# Patient Record
Sex: Female | Born: 1950 | Race: White | Hispanic: No | Marital: Married | Smoking: Never smoker
Health system: Southern US, Community
[De-identification: ages and names within clinical notes are randomized; demographics above are authoritative.]

---

## 2011-08-19 ENCOUNTER — Encounter (HOSPITAL_COMMUNITY)
Admission: EM | Disposition: A | Discharge status: Home / Self Care | Payer: Self-pay | Source: Home / Self Care | Attending: Orthopedic Surgery

## 2011-08-19 ENCOUNTER — Other Ambulatory Visit: Payer: Self-pay

## 2011-08-19 ENCOUNTER — Encounter (HOSPITAL_COMMUNITY): Payer: Self-pay | Admitting: Emergency Medicine

## 2011-08-19 ENCOUNTER — Emergency Department (HOSPITAL_COMMUNITY): Payer: BC Managed Care – PPO

## 2011-08-19 ENCOUNTER — Emergency Department (HOSPITAL_COMMUNITY): Payer: BC Managed Care – PPO | Admitting: Anesthesiology

## 2011-08-19 ENCOUNTER — Inpatient Hospital Stay (HOSPITAL_COMMUNITY)
Admission: EM | Admit: 2011-08-19 | Discharge: 2011-08-23 | DRG: 218 | Disposition: A | Discharge status: Home / Self Care | Payer: BC Managed Care – PPO | Attending: Orthopedic Surgery | Admitting: Orthopedic Surgery

## 2011-08-19 ENCOUNTER — Encounter (HOSPITAL_COMMUNITY): Payer: Self-pay | Admitting: Anesthesiology

## 2011-08-19 DIAGNOSIS — R9431 Abnormal electrocardiogram [ECG] [EKG]: Secondary | ICD-10-CM

## 2011-08-19 DIAGNOSIS — R209 Unspecified disturbances of skin sensation: Secondary | ICD-10-CM | POA: Diagnosis present

## 2011-08-19 DIAGNOSIS — S62101A Fracture of unspecified carpal bone, right wrist, initial encounter for closed fracture: Secondary | ICD-10-CM

## 2011-08-19 DIAGNOSIS — W108XXA Fall (on) (from) other stairs and steps, initial encounter: Secondary | ICD-10-CM | POA: Diagnosis present

## 2011-08-19 DIAGNOSIS — S52123A Displaced fracture of head of unspecified radius, initial encounter for closed fracture: Secondary | ICD-10-CM | POA: Diagnosis present

## 2011-08-19 DIAGNOSIS — S0990XA Unspecified injury of head, initial encounter: Secondary | ICD-10-CM

## 2011-08-19 DIAGNOSIS — G5600 Carpal tunnel syndrome, unspecified upper limb: Secondary | ICD-10-CM | POA: Diagnosis present

## 2011-08-19 DIAGNOSIS — S12110A Anterior displaced Type II dens fracture, initial encounter for closed fracture: Secondary | ICD-10-CM

## 2011-08-19 DIAGNOSIS — S52009A Unspecified fracture of upper end of unspecified ulna, initial encounter for closed fracture: Secondary | ICD-10-CM | POA: Diagnosis present

## 2011-08-19 DIAGNOSIS — S42401A Unspecified fracture of lower end of right humerus, initial encounter for closed fracture: Secondary | ICD-10-CM

## 2011-08-19 DIAGNOSIS — S52599A Other fractures of lower end of unspecified radius, initial encounter for closed fracture: Secondary | ICD-10-CM | POA: Diagnosis present

## 2011-08-19 DIAGNOSIS — S42453A Displaced fracture of lateral condyle of unspecified humerus, initial encounter for closed fracture: Principal | ICD-10-CM | POA: Diagnosis present

## 2011-08-19 DIAGNOSIS — S12100A Unspecified displaced fracture of second cervical vertebra, initial encounter for closed fracture: Secondary | ICD-10-CM | POA: Diagnosis present

## 2011-08-19 HISTORY — PX: ORIF ELBOW FRACTURE: SHX5031

## 2011-08-19 LAB — BASIC METABOLIC PANEL
Chloride: 102 mEq/L (ref 96–112)
Creatinine, Ser: 1 mg/dL (ref 0.50–1.10)
GFR calc Af Amer: 68 mL/min — ABNORMAL LOW (ref >90)
Potassium: 3 mEq/L — ABNORMAL LOW (ref 3.5–5.1)

## 2011-08-19 LAB — DIFFERENTIAL
Basophils Absolute: 0.1 10*3/uL (ref 0.0–0.1)
Basophils Relative: 0 % (ref 0–1)
Eosinophils Absolute: 0.2 K/uL (ref 0.0–0.7)
Eosinophils Relative: 1 % (ref 0–5)
Lymphocytes Relative: 16 % (ref 12–46)
Lymphs Abs: 2.5 K/uL (ref 0.7–4.0)
Monocytes Absolute: 1 K/uL (ref 0.1–1.0)
Monocytes Relative: 6 % (ref 3–12)
Neutro Abs: 11.8 10*3/uL — ABNORMAL HIGH (ref 1.7–7.7)
Neutrophils Relative %: 76 % (ref 43–77)

## 2011-08-19 LAB — BASIC METABOLIC PANEL WITH GFR
BUN: 16 mg/dL (ref 6–23)
CO2: 22 meq/L (ref 19–32)
Calcium: 8.7 mg/dL (ref 8.4–10.5)
GFR calc non Af Amer: 59 mL/min — ABNORMAL LOW (ref >90)
Glucose, Bld: 155 mg/dL — ABNORMAL HIGH (ref 70–99)
Sodium: 139 meq/L (ref 135–145)

## 2011-08-19 LAB — CBC
HCT: 37.1 % (ref 36.0–46.0)
Hemoglobin: 12.6 g/dL (ref 12.0–15.0)
MCH: 29 pg (ref 26.0–34.0)
MCHC: 34 g/dL (ref 30.0–36.0)
MCV: 85.3 fL (ref 78.0–100.0)
Platelets: 299 K/uL (ref 150–400)
RBC: 4.35 MIL/uL (ref 3.87–5.11)
RDW: 12.9 % (ref 11.5–15.5)
WBC: 15.5 K/uL — ABNORMAL HIGH (ref 4.0–10.5)

## 2011-08-19 LAB — POTASSIUM: Potassium: 3.4 mEq/L — ABNORMAL LOW (ref 3.5–5.1)

## 2011-08-19 SURGERY — OPEN REDUCTION INTERNAL FIXATION (ORIF) ELBOW/OLECRANON FRACTURE
Anesthesia: General | Site: Wrist | Laterality: Right | Wound class: Clean

## 2011-08-19 MED ORDER — MIDAZOLAM HCL 5 MG/5ML IJ SOLN
INTRAMUSCULAR | Status: DC | PRN
  Administered 2011-08-19: 2 mg via INTRAVENOUS

## 2011-08-19 MED ORDER — PROPOFOL 10 MG/ML IV BOLUS
INTRAVENOUS | Status: DC | PRN
  Administered 2011-08-19: 150 mg via INTRAVENOUS

## 2011-08-19 MED ORDER — LACTATED RINGERS IV SOLN
INTRAVENOUS | Status: DC | PRN
  Administered 2011-08-19 – 2011-08-20 (×2): via INTRAVENOUS

## 2011-08-19 MED ORDER — ACETAMINOPHEN 10 MG/ML IV SOLN
INTRAVENOUS | Status: DC | PRN
  Administered 2011-08-19: 1000 mg via INTRAVENOUS

## 2011-08-19 MED ORDER — SCOPOLAMINE 1 MG/3DAYS TD PT72
MEDICATED_PATCH | TRANSDERMAL | Status: AC
  Filled 2011-08-19: qty 1

## 2011-08-19 MED ORDER — ACETAMINOPHEN 10 MG/ML IV SOLN
INTRAVENOUS | Status: AC
  Filled 2011-08-19: qty 100

## 2011-08-19 MED ORDER — FENTANYL CITRATE 0.05 MG/ML IJ SOLN
INTRAMUSCULAR | Status: DC | PRN
  Administered 2011-08-19: 100 ug via INTRAVENOUS
  Administered 2011-08-20 (×10): 25 ug via INTRAVENOUS

## 2011-08-19 MED ORDER — DEXAMETHASONE SODIUM PHOSPHATE 10 MG/ML IJ SOLN
INTRAMUSCULAR | Status: DC | PRN
  Administered 2011-08-19: 10 mg via INTRAVENOUS

## 2011-08-19 MED ORDER — ONDANSETRON HCL 4 MG/2ML IJ SOLN
INTRAMUSCULAR | Status: AC
  Administered 2011-08-19 (×2): 4 mg via INTRAVENOUS
  Filled 2011-08-19: qty 2

## 2011-08-19 MED ORDER — ROCURONIUM BROMIDE 100 MG/10ML IV SOLN
INTRAVENOUS | Status: DC | PRN
  Administered 2011-08-19: 20 mg via INTRAVENOUS

## 2011-08-19 MED ORDER — SCOPOLAMINE 1 MG/3DAYS TD PT72
MEDICATED_PATCH | TRANSDERMAL | Status: DC | PRN
  Administered 2011-08-19: 1 via TRANSDERMAL

## 2011-08-19 MED ORDER — SUCCINYLCHOLINE CHLORIDE 20 MG/ML IJ SOLN
INTRAMUSCULAR | Status: DC | PRN
  Administered 2011-08-19: 100 mg via INTRAVENOUS

## 2011-08-19 MED ORDER — HYDROMORPHONE HCL PF 1 MG/ML IJ SOLN
0.5000 mg | Freq: Once | INTRAMUSCULAR | Status: AC
  Administered 2011-08-19: 0.5 mg via INTRAVENOUS
  Filled 2011-08-19: qty 1

## 2011-08-19 MED ORDER — CEFAZOLIN SODIUM-DEXTROSE 2-3 GM-% IV SOLR
INTRAVENOUS | Status: AC
  Filled 2011-08-19: qty 50

## 2011-08-19 MED ORDER — CEFAZOLIN SODIUM 1-5 GM-% IV SOLN
INTRAVENOUS | Status: DC | PRN
  Administered 2011-08-19: 2 g via INTRAVENOUS

## 2011-08-19 SURGICAL SUPPLY — 72 items
BAG ZIPLOCK 12X15 (MISCELLANEOUS) ×3 IMPLANT
BANDAGE GAUZE ELAST BULKY 4 IN (GAUZE/BANDAGES/DRESSINGS) ×6 IMPLANT
BIT DRILL 2 FAST STEP (BIT) ×3 IMPLANT
BIT DRILL 2.5X2.75 QC CALB (BIT) ×3 IMPLANT
BIT DRILL 2.5X4 QC (BIT) ×3 IMPLANT
BIT DRILL CALIBRATED 2.7 (BIT) ×3 IMPLANT
BIT DRILL MICR ACTRK 2 LNG PRF (BIT) ×2 IMPLANT
BLADE SURG SZ10 CARB STEEL (BLADE) ×6 IMPLANT
BNDG COHESIVE 4X5 TAN STRL (GAUZE/BANDAGES/DRESSINGS) ×6 IMPLANT
CLOTH BEACON ORANGE TIMEOUT ST (SAFETY) ×3 IMPLANT
CORDS BIPOLAR (ELECTRODE) ×3 IMPLANT
CUFF TOURN SGL QUICK 18 (TOURNIQUET CUFF) ×3 IMPLANT
DRAIN PENROSE 18X1/4 LTX STRL (WOUND CARE) IMPLANT
DRAPE BACK TABLE (DRAPES) ×3 IMPLANT
DRAPE C-ARM 42X72 X-RAY (DRAPES) IMPLANT
DRAPE OEC MINIVIEW 54X84 (DRAPES) ×3 IMPLANT
DRAPE SURG 17X11 SM STRL (DRAPES) ×3 IMPLANT
DRILL MICRO ACUTRAK 2 LNG PROF (BIT) ×3
DRSG EMULSION OIL 3X3 NADH (GAUZE/BANDAGES/DRESSINGS) ×3 IMPLANT
ELECT REM PT RETURN 9FT ADLT (ELECTROSURGICAL) ×3
ELECTRODE REM PT RTRN 9FT ADLT (ELECTROSURGICAL) ×2 IMPLANT
GAUZE XEROFORM 5X9 LF (GAUZE/BANDAGES/DRESSINGS) ×3 IMPLANT
GLOVE BIO SURGEON STRL SZ8 (GLOVE) ×9 IMPLANT
GOWN STRL REIN XL XLG (GOWN DISPOSABLE) ×6 IMPLANT
GUIDEWIRE ORTHO MICROSHT  ACUT (WIRE) ×3
GUIDEWIRE ORTHO MICROSHT .035 (WIRE) ×6 IMPLANT
KIT BASIN OR (CUSTOM PROCEDURE TRAY) ×3 IMPLANT
LOOP VESSEL MAXI BLUE (MISCELLANEOUS) ×3 IMPLANT
MANIFOLD NEPTUNE II (INSTRUMENTS) IMPLANT
NS IRRIG 1000ML POUR BTL (IV SOLUTION) ×3 IMPLANT
PACK LOWER EXTREMITY WL (CUSTOM PROCEDURE TRAY) ×3 IMPLANT
PAD CAST 3X4 CTTN HI CHSV (CAST SUPPLIES) ×6 IMPLANT
PAD CAST 4YDX4 CTTN HI CHSV (CAST SUPPLIES) ×6 IMPLANT
PADDING CAST COTTON 3X4 STRL (CAST SUPPLIES) ×3
PADDING CAST COTTON 4X4 STRL (CAST SUPPLIES) ×3
PADDING WEBRIL 4 STERILE (GAUZE/BANDAGES/DRESSINGS) ×3 IMPLANT
PEG SUBCHONDRAL SMOOTH 2.0X16 (Peg) ×3 IMPLANT
PEG SUBCHONDRAL SMOOTH 2.0X20 (Peg) ×9 IMPLANT
PEG THREADED 2.5MMX18MM LONG (Peg) ×3 IMPLANT
PEG THREADED 2.5MMX20MM LONG (Peg) ×9 IMPLANT
PEG THREADED 2.5MMX22MM LONG (Peg) ×3 IMPLANT
PLATE LOCK RT SM (Plate) ×1 IMPLANT
PLATE LOCK RT SM 7 HL (Plate) ×2 IMPLANT
PLATE STAN 24.4X59.5 RT (Plate) ×3 IMPLANT
POSITIONER SURGICAL ARM (MISCELLANEOUS) ×3 IMPLANT
PUTTY ORTHOBLAST II 5CC (Orthopedic Implant) ×3 IMPLANT
SCOTCHCAST PLUS 2X4 WHITE (CAST SUPPLIES) ×3 IMPLANT
SCOTCHCAST PLUS 5X4 WHITE (CAST SUPPLIES) ×6 IMPLANT
SCREW ACUTRAK 2 MICRO 16MM (Screw) ×3 IMPLANT
SCREW BN 12X3.5XNS CORT TI (Screw) ×2 IMPLANT
SCREW CORT 3.5X10 LNG (Screw) ×9 IMPLANT
SCREW CORT 3.5X12 (Screw) ×1 IMPLANT
SCREW CORT T15 28X3.5XST LCK (Screw) ×2 IMPLANT
SCREW CORTICAL 3.5X28MM (Screw) ×1 IMPLANT
SCREW LOCK 3.5X36 DIST TIB (Screw) ×3 IMPLANT
SCREW LOCK CORT STAR 3.5X18 (Screw) ×3 IMPLANT
SOL PREP POV-IOD 16OZ 10% (MISCELLANEOUS) ×3 IMPLANT
SOL PREP PROV IODINE SCRUB 4OZ (MISCELLANEOUS) ×3 IMPLANT
SPONGE GAUZE 4X4 12PLY (GAUZE/BANDAGES/DRESSINGS) ×3 IMPLANT
SPONGE LAP 4X18 X RAY DECT (DISPOSABLE) ×18 IMPLANT
SPONGE SURGIFOAM ABS GEL 100 (HEMOSTASIS) ×3 IMPLANT
SUT FIBERWIRE #2 38 T-5 BLUE (SUTURE) ×3
SUT MERSILENE 3 0 FS 1 (SUTURE) IMPLANT
SUT PROLENE 3 0 PS 2 (SUTURE) IMPLANT
SUT PROLENE 4 0 PS 2 18 (SUTURE) ×18 IMPLANT
SUT VIC AB 1 CTX 36 (SUTURE)
SUT VIC AB 1 CTX36XBRD ANBCTR (SUTURE) IMPLANT
SUT VIC AB 2-0 CTX 36 (SUTURE) IMPLANT
SUTURE FIBERWR #2 38 T-5 BLUE (SUTURE) ×2 IMPLANT
TOWEL OR 17X26 10 PK STRL BLUE (TOWEL DISPOSABLE) ×3 IMPLANT
WASHER 3.5MM (Orthopedic Implant) ×3 IMPLANT
WATER STERILE IRR 1500ML POUR (IV SOLUTION) IMPLANT

## 2011-08-19 NOTE — Preoperative (Signed)
Beta Blockers   Reason not to administer Beta Blockers:Not Applicable 

## 2011-08-19 NOTE — ED Notes (Signed)
Permit signed by spouse of or

## 2011-08-19 NOTE — ED Notes (Signed)
[facility]RE[facility] NEUROLOGY will replac

## 2011-08-19 NOTE — ED Notes (Signed)
OR called with the repeat Potassium of 3.4

## 2011-08-19 NOTE — Consult Note (Signed)
Reason for Consult: C2 fracture Referring Physician: Quita Skye M.D.  Julie Russell is an 61 y.o. female.  HPI: Patient is a 61 year old individual who took a fall down a flight of stairs no head over heels. She sustained a fracture to the right upper extremity and complained of some neck pain. CT scan demonstrates the presence of a type II odontoid fracture is nondisplaced. Neurologically the patient has been intact. Save for the right upper extremity which is in a splint.  History reviewed. No pertinent past medical history.  No past surgical history on file.  No family history on file.  Social History:  does not have a smoking history on file. She does not have any smokeless tobacco history on file. Her alcohol and drug histories not on file.  Allergies: No Known Allergies  Medications: None available  Results for orders placed during the hospital encounter of 08/19/11 (from the past 48 hour(s))  CBC     Status: Abnormal   Collection Time   08/19/11  8:47 PM      Component Value Range Comment   WBC 15.5 (*) 4.0 - 10.5 (K/uL)    RBC 4.35  3.87 - 5.11 (MIL/uL)    Hemoglobin 12.6  12.0 - 15.0 (g/dL)    HCT 16.1  09.6 - 04.5 (%)    MCV 85.3  78.0 - 100.0 (fL)    MCH 29.0  26.0 - 34.0 (pg)    MCHC 34.0  30.0 - 36.0 (g/dL)    RDW 40.9  81.1 - 91.4 (%)    Platelets 299  150 - 400 (K/uL)   DIFFERENTIAL     Status: Abnormal   Collection Time   08/19/11  8:47 PM      Component Value Range Comment   Neutrophils Relative 76  43 - 77 (%)    Neutro Abs 11.8 (*) 1.7 - 7.7 (K/uL)    Lymphocytes Relative 16  12 - 46 (%)    Lymphs Abs 2.5  0.7 - 4.0 (K/uL)    Monocytes Relative 6  3 - 12 (%)    Monocytes Absolute 1.0  0.1 - 1.0 (K/uL)    Eosinophils Relative 1  0 - 5 (%)    Eosinophils Absolute 0.2  0.0 - 0.7 (K/uL)    Basophils Relative 0  0 - 1 (%)    Basophils Absolute 0.1  0.0 - 0.1 (K/uL)   BASIC METABOLIC PANEL     Status: Abnormal   Collection Time   08/19/11  8:47 PM   Component Value Range Comment   Sodium 139  135 - 145 (mEq/L)    Potassium 3.0 (*) 3.5 - 5.1 (mEq/L)    Chloride 102  96 - 112 (mEq/L)    CO2 22  19 - 32 (mEq/L)    Glucose, Bld 155 (*) 70 - 99 (mg/dL)    BUN 16  6 - 23 (mg/dL)    Creatinine, Ser 7.82  0.50 - 1.10 (mg/dL)    Calcium 8.7  8.4 - 10.5 (mg/dL)    GFR calc non Af Amer 59 (*) >90 (mL/min)    GFR calc Af Amer 68 (*) >90 (mL/min)     Dg Forearm Right  08/19/2011  *RADIOLOGY REPORT*  Clinical Data: Status post fall, for deformity.  RIGHT FOREARM - 2 VIEW  Comparison: None.  Findings: Fracture dislocation at the elbow joint, with a comminuted fracture of the medial humeral condyle with displacement. There is bayonet deformity of the joint, with proximal  displacement of the radius and ulna relative to the humerus. In addition, there is a fracture of distal radius with dorsal and proximal displacement of the distal component. The radiocarpal joint also appears disrupted. The ulna appears sclerotic, this may be exaggerated by technique.  Attention on follow-up.  IMPRESSION: Fracture dislocation of the right elbow and distal radius fracture with radiocarpal extension.  Original Report Authenticated By: Waneta Martins, M.D.   Ct Cervical Spine Wo Contrast  08/19/2011  *RADIOLOGY REPORT*  Clinical Data: Posterior neck pain and right arm pain after falling down stairs.  CT CERVICAL SPINE WITHOUT CONTRAST  Technique:  Multidetector CT imaging of the cervical spine was performed. Multiplanar CT image reconstructions were also generated.  Comparison: None.  Findings: There is a fracture through the base of the odontoid process with 1 mm posterior displacement of the odontoid and C1 with respect to the body of C2.  Minimal prevertebral soft tissue swelling.  No compromise of the spinal canal.  No compression of the spinal cord.  No other acute abnormality of the cervical spine.  Degenerative disc disease at C5-6 and C6-7.  IMPRESSION: Acute fracture  through the base of the odontoid with 1 mm posterior displacement.  Critical Value/emergent results were called by telephone at the time of interpretation on 08/19/2011  at 8:55 p.m.  to  Dr. Oletta Lamas, who verbally acknowledged these results.  Original Report Authenticated By: Gwynn Burly, M.D.   Dg Chest Portable 1 View  08/19/2011  *RADIOLOGY REPORT*  Clinical Data: Preoperative radiograph for right arm surgery.  PORTABLE CHEST - 1 VIEW  Comparison: None.  Findings: Mild interstitial prominence, may be exaggerated by technique.  No focal consolidation.  Cardiomediastinal contours within normal limits.  No pleural effusion or pneumothorax.  No acute osseous abnormality.  IMPRESSION: No focal consolidation.  Original Report Authenticated By: Waneta Martins, M.D.    Review of Systems  Constitutional: Negative.   HENT: Positive for neck pain.   Eyes: Negative.   Respiratory: Negative.   Cardiovascular: Negative.   Gastrointestinal: Negative.   Genitourinary: Negative.   Skin: Negative.   Neurological: Positive for tingling.       Tingling in fingers on right hand  Endo/Heme/Allergies: Negative.   Psychiatric/Behavioral: Negative.    Blood pressure 142/81, pulse 67, temperature 97.8 F (36.6 C), temperature source Oral, resp. rate 18, height 5\' 2"  (1.575 m), weight 67.132 kg (148 lb), SpO2 100.00%. Physical Exam  Constitutional: She is oriented to person, place, and time. She appears well-developed and well-nourished.  Eyes: EOM are normal. Pupils are equal, round, and reactive to light.  Neck:       Immobilized in hard cervical collar complains of pain in her right posterior scapular region  Musculoskeletal:       Right upper extremity in splint able to move fingers on distal extremity left upper extremity moves well both lower extremities move normally  Neurological: She is alert and oriented to person, place, and time.  Skin: Skin is warm and dry.  Psychiatric: She has a normal  mood and affect. Her behavior is normal. Judgment and thought content normal.    Assessment/Plan: Patient has a type II odontoid fracture which is nondisplaced she should be maintained in a hard cervical collar such as an Aspen collar. I discussed the situation with Dr. Judith Part and I believe that the patient can be safely intubated with routine cervical precautions collar may be removed for the time of intubation. I will follow  patient up as an outpatient is to be maintained in a hard cervical collar such as an Aspen collar and she'll be fitted for this in the emergency department.  Noble Cicalese J 08/19/2011, 10:43 PM

## 2011-08-19 NOTE — H&P (Signed)
Julie Russell is an 61 y.o. female.   Chief Complaint: Fall down stairs with fracture dislocation right elbow and right wrist HPI: Patient is a 61 year old female who fell down steps this evening. She sustained severe fracture dislocation to her right elbow and right wrist. She complains of severe pain. She has refill to her fingers. She is in a splint. She was transferred to the emergency room by EMS. She denies lower extremity pain complaints. She denies lower back pain. She denies chest pain. She denies left upper extremity pain. She is in a c-collar and has a C2 fracture. Dr.Elsner has reviewed and feels this is stable for nonoperative treatment.  Patient denies prior history of injury to the extremity.  History reviewed. No pertinent past medical history.  No past surgical history on file.  No family history on file. Social History:  does not have a smoking history on file. She does not have any smokeless tobacco history on file. Her alcohol and drug histories not on file.  Allergies: No Known Allergies  Medications Prior to Admission  Medication Dose Route Frequency Provider Last Rate Last Dose  . HYDROmorphone (DILAUDID) injection 0.5 mg  0.5 mg Intravenous Once Gavin Pound. Ghim, MD   0.5 mg at 08/19/11 2003  . ondansetron (ZOFRAN) 4 MG/2ML injection        4 mg at 08/19/11 1953   No current outpatient prescriptions on file as of 08/19/2011.    Results for orders placed during the hospital encounter of 08/19/11 (from the past 48 hour(s))  CBC     Status: Abnormal   Collection Time   08/19/11  8:47 PM      Component Value Range Comment   WBC 15.5 (*) 4.0 - 10.5 (K/uL)    RBC 4.35  3.87 - 5.11 (MIL/uL)    Hemoglobin 12.6  12.0 - 15.0 (g/dL)    HCT 16.1  09.6 - 04.5 (%)    MCV 85.3  78.0 - 100.0 (fL)    MCH 29.0  26.0 - 34.0 (pg)    MCHC 34.0  30.0 - 36.0 (g/dL)    RDW 40.9  81.1 - 91.4 (%)    Platelets 299  150 - 400 (K/uL)   DIFFERENTIAL     Status: Abnormal   Collection  Time   08/19/11  8:47 PM      Component Value Range Comment   Neutrophils Relative 76  43 - 77 (%)    Neutro Abs 11.8 (*) 1.7 - 7.7 (K/uL)    Lymphocytes Relative 16  12 - 46 (%)    Lymphs Abs 2.5  0.7 - 4.0 (K/uL)    Monocytes Relative 6  3 - 12 (%)    Monocytes Absolute 1.0  0.1 - 1.0 (K/uL)    Eosinophils Relative 1  0 - 5 (%)    Eosinophils Absolute 0.2  0.0 - 0.7 (K/uL)    Basophils Relative 0  0 - 1 (%)    Basophils Absolute 0.1  0.0 - 0.1 (K/uL)   BASIC METABOLIC PANEL     Status: Abnormal   Collection Time   08/19/11  8:47 PM      Component Value Range Comment   Sodium 139  135 - 145 (mEq/L)    Potassium 3.0 (*) 3.5 - 5.1 (mEq/L)    Chloride 102  96 - 112 (mEq/L)    CO2 22  19 - 32 (mEq/L)    Glucose, Bld 155 (*) 70 - 99 (mg/dL)  BUN 16  6 - 23 (mg/dL)    Creatinine, Ser 1.19  0.50 - 1.10 (mg/dL)    Calcium 8.7  8.4 - 10.5 (mg/dL)    GFR calc non Af Amer 59 (*) >90 (mL/min)    GFR calc Af Amer 68 (*) >90 (mL/min)    Dg Forearm Right  08/19/2011  *RADIOLOGY REPORT*  Clinical Data: Status post fall, for deformity.  RIGHT FOREARM - 2 VIEW  Comparison: None.  Findings: Fracture dislocation at the elbow joint, with a comminuted fracture of the medial humeral condyle with displacement. There is bayonet deformity of the joint, with proximal displacement of the radius and ulna relative to the humerus. In addition, there is a fracture of distal radius with dorsal and proximal displacement of the distal component. The radiocarpal joint also appears disrupted. The ulna appears sclerotic, this may be exaggerated by technique.  Attention on follow-up.  IMPRESSION: Fracture dislocation of the right elbow and distal radius fracture with radiocarpal extension.  Original Report Authenticated By: Waneta Martins, M.D.   Ct Cervical Spine Wo Contrast  08/19/2011  *RADIOLOGY REPORT*  Clinical Data: Posterior neck pain and right arm pain after falling down stairs.  CT CERVICAL SPINE WITHOUT  CONTRAST  Technique:  Multidetector CT imaging of the cervical spine was performed. Multiplanar CT image reconstructions were also generated.  Comparison: None.  Findings: There is a fracture through the base of the odontoid process with 1 mm posterior displacement of the odontoid and C1 with respect to the body of C2.  Minimal prevertebral soft tissue swelling.  No compromise of the spinal canal.  No compression of the spinal cord.  No other acute abnormality of the cervical spine.  Degenerative disc disease at C5-6 and C6-7.  IMPRESSION: Acute fracture through the base of the odontoid with 1 mm posterior displacement.  Critical Value/emergent results were called by telephone at the time of interpretation on 08/19/2011  at 8:55 p.m.  to  Dr. Oletta Lamas, who verbally acknowledged these results.  Original Report Authenticated By: Gwynn Burly, M.D.    Review of Systems  Constitutional: Negative.   HENT: Positive for neck pain.   Eyes: Negative.   Respiratory: Negative.   Cardiovascular: Negative.   Gastrointestinal: Negative.   Genitourinary: Negative.   Musculoskeletal: Positive for falls.       Please see history of present illness  Skin: Negative.   Neurological: Negative.   Endo/Heme/Allergies: Negative.   Psychiatric/Behavioral: Negative.     Blood pressure 127/78, pulse 63, temperature 97.8 F (36.6 C), temperature source Oral, resp. rate 16, SpO2 99.00%. Physical Exam pleasant female in a c-collar who complains of severe pain in her right upper extremity she has equally reactive pupils. She has clear oropharynx. She has equal chest expansion and no shortness of breath. Abdomen is nontender. Lower extremity examination is stable. She is neurovascularly intact. She moves her toes knees and hips well and can perform a straight leg raise without pain. Pelvis stable. She has no evidence of left upper extremity pain or complaints. She has a normal neurovascular examination about left upper  extremity. The right upper extremity has severe swelling and a noted closed fracture dislocation to the right elbow and right radius. She has intact refill to the fingers. Her swelling makes it difficult to evaluate her pulse. She has light touch sensation is slightly diminished.  Her x-rays are reviewed at length. Unfortunately this is a very severe fracture dislocation to right elbow and right radius. Is  very comminuted and complex.  Assessment/Plan .Marland KitchenWe are planning surgery for your upper extremity. The risk and benefits of surgery include risk of bleeding infection anesthesia damage to normal structures and failure of the surgery to accomplish its intended goals of relieving symptoms and restoring function with this in mind we'll going to proceed. I have specifically discussed with the patient the pre-and postoperative regime and the does and don'ts and risk and benefits in great detail. Risk and benefits of surgery also include risk of dystrophy chronic nerve pain failure of the healing process to go onto completion and other inherent risks of surgery The relavent the pathophysiology of the disease/injury process, as well as the alternatives for treatment and postoperative course of action has been discussed in great detail with the patient who desires to proceed.  We will do everything in our power to help you (the patient) restore function to the upper extremity. Is a pleasure to see this patient today. I have discussed all  issues with the family. Will proceed to the surgical arena as soon as possible. I would give this a very guarded prognosis given the severity of her injury as well as the fragmentation/comminution and degree of osteoporosis. Patient stands at we are planning to stabilize the fractures as best as possible and proceed accordingly.  I discussed with neurosurgery. They recommend closed treatment of her neck and usual cervical spine precautions with intubation by  anesthesia   Aron Baba M 08/19/2011, 9:52 PM

## 2011-08-19 NOTE — Anesthesia Preprocedure Evaluation (Addendum)
Anesthesia Evaluation  Patient identified by MRN, date of birth, ID band Patient awake    Reviewed: Allergy & Precautions, H&P , NPO status , Patient's Chart, lab work & pertinent test results  Airway Mallampati: II TM Distance: <3 FB   Mouth opening: Limited Mouth Opening Comment: Limited mouth opening secondary to rigid cervical collar. Dental  (+) Dental Advisory Given and Teeth Intact   Pulmonary neg pulmonary ROS,  clear to auscultation  Pulmonary exam normal       Cardiovascular neg cardio ROS Regular Normal    Neuro/Psych C1 odontoid fracture; patient evaluated by neurosurgeon and cleared for surgical procedure of R arm. States pain in c-spine 4./10 in preop holding area. Negative Neurological ROS  Negative Psych ROS   GI/Hepatic negative GI ROS, Neg liver ROS,   Endo/Other  Negative Endocrine ROS  Renal/GU negative Renal ROS  Genitourinary negative   Musculoskeletal negative musculoskeletal ROS (+)   Abdominal   Peds negative pediatric ROS (+)  Hematology negative hematology ROS (+)   Anesthesia Other Findings   Reproductive/Obstetrics negative OB ROS                          Anesthesia Physical Anesthesia Plan  ASA: III and Emergent  Anesthesia Plan: General   Post-op Pain Management:    Induction: Intravenous and Cricoid pressure planned  Airway Management Planned: Oral ETT  Additional Equipment:   Intra-op Plan:   Post-operative Plan: Extubation in OR  Informed Consent: I have reviewed the patients History and Physical, chart, labs and discussed the procedure including the risks, benefits and alternatives for the proposed anesthesia with the patient or authorized representative who has indicated his/her understanding and acceptance.   Dental advisory given  Plan Discussed with: CRNA  Anesthesia Plan Comments: (Maintain neutral position of c-spine throughout induction,  laryngoscopy, and intubation.)        Anesthesia Quick Evaluation

## 2011-08-19 NOTE — ED Notes (Signed)
GEX:BM84<XL> Expected date:08/19/11<BR> Expected time: 7:02 PM<BR> Means of arrival:Ambulance<BR> Comments:<BR> M41. 61 yo f. Trip and fall. Arm fx, neck pain. 5 min

## 2011-08-19 NOTE — ED Notes (Signed)
Patient sts that she is very nauseated. Dr Mylinda Latina notified and zofran ordered.  Patient placed on CM due to the amount of fentanyl that she was given PTA by ems.  Zofran ordered and given.  MD ordered additional dilaudid 0.5 mg iv for pain given IV dilute.  Good distal pulse noted in right wrist and good cap refill even though patient sts that her fingers are tingly

## 2011-08-19 NOTE — ED Notes (Signed)
Aspen collar replaced by the neurologist

## 2011-08-19 NOTE — ED Notes (Signed)
Per EMS the patient fell down a flight of stairs with no L:OC, but sustained a deformity to the R wrist. ems sts that the patient can not feel fingers but does have good refill.  A/o x 3

## 2011-08-19 NOTE — ED Notes (Signed)
To xr via bed icepack in place

## 2011-08-19 NOTE — ED Notes (Signed)
EMS sts that the patient received 350 mcg of Fentanyl prior to arrival.  # 18 to [facility]

## 2011-08-19 NOTE — ED Provider Notes (Addendum)
History     CSN: [id number]  Arrival date & time 08/19/11  1907   First MD Initiated Contact with Patient 08/19/11 1926      Chief Complaint  Patient presents with  . Fall    (Consider location/radiation/quality/duration/timing/severity/associated sxs/prior treatment) HPI Comments: Pt was at a friend's home and was walking to look at a painting, wasn't looking where she was going and fell down a whole flight of stairs.  She reports head injury, but soreness to neck, no LOC.  She has deformity and sig pain to right wrist and elbow, numbness and tingling to fingers.  No CP, SOB, abd pain, back pain.  No pain in lower extremities.   She denies smoking or drinking.    Patient is a 61 y.o. female presenting with fall. The history is provided by the patient, the spouse and the EMS personnel.  Fall    History reviewed. No pertinent past medical history.  No past surgical history on file.  No family history on file.  History  Substance Use Topics  . Smoking status: Not on file  . Smokeless tobacco: Not on file  . Alcohol Use: Not on file    OB History    Grav Para Term Preterm Abortions TAB SAB Ect Mult Living                  Review of Systems  All other systems reviewed and are negative.    Allergies  Review of patient's allergies indicates no known allergies.  Home Medications   Current Outpatient Rx  Name Route Sig Dispense Refill  . CALCIUM CARBONATE 1250 MG PO CHEW Oral Chew 1 tablet by mouth daily.    . ADULT MULTIVITAMIN W/MINERALS CH Oral Take 1 tablet by mouth daily.      BP 127/78  Pulse 63  Temp(Src) 97.8 F (36.6 C) (Oral)  Resp 16  SpO2 99%  Physical Exam  Nursing note and vitals reviewed. Constitutional: She is oriented to person, place, and time. She appears well-developed and well-nourished.  HENT:  Head: Normocephalic.  Eyes: Pupils are equal, round, and reactive to light.  Neck:       No step-offs and no tenderness on palpation along  her posterior vertebral bodies. However with range of motion, the patient reports soreness. The patient also receives several doses of fentanyl by EMS.  Cardiovascular: Normal rate and regular rhythm.   Pulmonary/Chest: Effort normal and breath sounds normal.  Abdominal: Soft. Normal appearance. There is no tenderness. There is no CVA tenderness.  Musculoskeletal:       No tenderness along the posterior thoracic or lumbar spine. No bruising or step-off seen. The patient has obvious deformity of her right distal forearm and wrist with significant swelling. Cap refill is normal in her fingers. The patient reports some subjective gross sensation loss. She is able to wiggle her fingers. No abnormality grossly to her elbow, however she has tenderness in the posterior elbow region. No tenderness along her upper arm. No shoulder tenderness per full range of motion of both hips, knees, ankles. She has full range of motion of her left shoulder, elbow and wrist and hand. She reports slight soreness to her left thumb, however there is no swelling, bruising or deficits range of motion.  Neurological: She is alert and oriented to person, place, and time. She has normal strength. A sensory deficit is present. GCS eye subscore is 4. GCS verbal subscore is 5. GCS motor subscore is 6.  ED Course  Procedures (including critical care time)  CRITICAL CARE Performed by: Lear Ng.   Total critical care time: 35 min  Critical care time was exclusive of separately billable procedures and treating other patients.  Critical care was necessary to treat or prevent imminent or life-threatening deterioration.  Critical care was time spent personally by me on the following activities: development of treatment plan with patient and/or surrogate as well as nursing, discussions with consultants, evaluation of patient's response to treatment, examination of patient, obtaining history from patient or surrogate, ordering  and performing treatments and interventions, ordering and review of laboratory studies, ordering and review of radiographic studies, pulse oximetry and re-evaluation of patient's condition.   Labs Reviewed  CBC - Abnormal; Notable for the following:    WBC 15.5 (*)    All other components within normal limits  DIFFERENTIAL - Abnormal; Notable for the following:    Neutro Abs 11.8 (*)    All other components within normal limits  BASIC METABOLIC PANEL   Dg Forearm Right  08/19/2011  *RADIOLOGY REPORT*  Clinical Data: Status post fall, for deformity.  RIGHT FOREARM - 2 VIEW  Comparison: None.  Findings: Fracture dislocation at the elbow joint, with a comminuted fracture of the medial humeral condyle with displacement. There is bayonet deformity of the joint, with proximal displacement of the radius and ulna relative to the humerus. In addition, there is a fracture of distal radius with dorsal and proximal displacement of the distal component. The radiocarpal joint also appears disrupted. The ulna appears sclerotic, this may be exaggerated by technique.  Attention on follow-up.  IMPRESSION: Fracture dislocation of the right elbow and distal radius fracture with radiocarpal extension.  Original Report Authenticated By: Waneta Martins, M.D.   Ct Cervical Spine Wo Contrast  08/19/2011  *RADIOLOGY REPORT*  Clinical Data: Posterior neck pain and right arm pain after falling down stairs.  CT CERVICAL SPINE WITHOUT CONTRAST  Technique:  Multidetector CT imaging of the cervical spine was performed. Multiplanar CT image reconstructions were also generated.  Comparison: None.  Findings: There is a fracture through the base of the odontoid process with 1 mm posterior displacement of the odontoid and C1 with respect to the body of C2.  Minimal prevertebral soft tissue swelling.  No compromise of the spinal canal.  No compression of the spinal cord.  No other acute abnormality of the cervical spine.  Degenerative  disc disease at C5-6 and C6-7.  IMPRESSION: Acute fracture through the base of the odontoid with 1 mm posterior displacement.  Critical Value/emergent results were called by telephone at the time of interpretation on 08/19/2011  at 8:55 p.m.  to  Dr. Oletta Lamas, who verbally acknowledged these results.  Original Report Authenticated By: Gwynn Burly, M.D.     1. Odontoid fracture   2. Wrist fracture, right   3. Minor head injury   4. Elbow fracture, right       MDM   Patient is in no respiratory distress. She was nontender in her chest and abdomen. She has slight contusions to her for head. However she did not have loss of consciousness and has had no nausea and vomiting at this time so I do not feel the patient requires a head CT scan. She is not on any blood thinners. I reviewed the patient's CT of her C-spine as well as her plain films of her right forearm. Per Dr. Jena Gauss, she has a odontoid fracture.  I've also spoken to  Dr. Amanda Pea who is on call for hand surgery do to her distal right radius fracture with significant displacement, also possible elbow fracture. His desires to take her to the operating room. I contacted neurosurgeon, Dr. Danielle Dess who will review the films and give [place] his input on the safety of taking her to the operating room.      9:32 PM Dr. Danielle Dess reports type 2 odontoid fracture, stable, just needs to be in a hard collar and be intubated with standard precautions.  He can see pt later after arm issues are taken care of.    Gavin Pound. Graviel Payeur, MD 08/19/11 2133    ECG at time 22:25 shows a normal sinus rhythm at a rate of 72. Normal axis. There are nonspecific T wave changes in the inferior and lateral leads. There are no ST changes. There are no old EKGs to compare.  Gavin Pound. Graydon Fofana, MD 08/21/11 1610

## 2011-08-19 NOTE — ED Notes (Signed)
Changed to aspen collar by Denice Bors

## 2011-08-19 NOTE — ED Notes (Signed)
To surgery via bed

## 2011-08-20 ENCOUNTER — Inpatient Hospital Stay (HOSPITAL_COMMUNITY): Payer: BC Managed Care – PPO

## 2011-08-20 ENCOUNTER — Encounter (HOSPITAL_COMMUNITY): Payer: Self-pay | Admitting: *Deleted

## 2011-08-20 MED ORDER — TEMAZEPAM 15 MG PO CAPS: 15.0000 mg | ORAL_CAPSULE | Freq: Every evening | ORAL | Status: DC | PRN

## 2011-08-20 MED ORDER — PROMETHAZINE HCL 25 MG/ML IJ SOLN
12.5000 mg | Freq: Four times a day (QID) | INTRAMUSCULAR | Status: DC | PRN
  Administered 2011-08-20: 15:00:00 via INTRAVENOUS
  Filled 2011-08-20: qty 1

## 2011-08-20 MED ORDER — CEFAZOLIN SODIUM 1-5 GM-% IV SOLN
1.0000 g | Freq: Three times a day (TID) | INTRAVENOUS | Status: DC
  Administered 2011-08-20 – 2011-08-23 (×9): 1 g via INTRAVENOUS
  Filled 2011-08-20 (×14): qty 50

## 2011-08-20 MED ORDER — PROMETHAZINE HCL 25 MG/ML IJ SOLN: 6.2500 mg | INTRAMUSCULAR | Status: DC | PRN

## 2011-08-20 MED ORDER — SENNA 8.6 MG PO TABS
1.0000 | ORAL_TABLET | Freq: Two times a day (BID) | ORAL | Status: DC
  Administered 2011-08-21 – 2011-08-23 (×5): 8.6 mg via ORAL
  Filled 2011-08-20 (×6): qty 1

## 2011-08-20 MED ORDER — CEFAZOLIN SODIUM 1-5 GM-% IV SOLN
1.0000 g | INTRAVENOUS | Status: AC
  Administered 2011-08-20: 1 g via INTRAVENOUS

## 2011-08-20 MED ORDER — LACTATED RINGERS IV SOLN: INTRAVENOUS | Status: DC

## 2011-08-20 MED ORDER — HYDROMORPHONE HCL PF 1 MG/ML IJ SOLN
0.5000 mg | INTRAMUSCULAR | Status: DC | PRN
  Administered 2011-08-20 – 2011-08-21 (×5): 1 mg via INTRAVENOUS
  Filled 2011-08-20 (×5): qty 1

## 2011-08-20 MED ORDER — LACTATED RINGERS IV SOLN
INTRAVENOUS | Status: DC
  Administered 2011-08-20 – 2011-08-21 (×2): via INTRAVENOUS

## 2011-08-20 MED ORDER — ONDANSETRON HCL 4 MG/2ML IJ SOLN
4.0000 mg | Freq: Four times a day (QID) | INTRAMUSCULAR | Status: DC | PRN
  Administered 2011-08-19 – 2011-08-20 (×2): 4 mg via INTRAVENOUS
  Filled 2011-08-20 (×2): qty 2

## 2011-08-20 MED ORDER — FENTANYL CITRATE 0.05 MG/ML IJ SOLN
INTRAMUSCULAR | Status: AC
  Administered 2011-08-20: 25 ug via INTRAVENOUS
  Filled 2011-08-20: qty 2

## 2011-08-20 MED ORDER — VITAMIN C 500 MG PO TABS
1000.0000 mg | ORAL_TABLET | Freq: Every day | ORAL | Status: DC
  Administered 2011-08-21 – 2011-08-23 (×3): 1000 mg via ORAL
  Filled 2011-08-20 (×4): qty 2

## 2011-08-20 MED ORDER — DIPHENHYDRAMINE HCL 25 MG PO CAPS: 25.0000 mg | ORAL_CAPSULE | Freq: Four times a day (QID) | ORAL | Status: DC | PRN

## 2011-08-20 MED ORDER — LACTATED RINGERS IV SOLN
INTRAVENOUS | Status: DC
  Administered 2011-08-20: 14:00:00 via INTRAVENOUS

## 2011-08-20 MED ORDER — FAMOTIDINE 20 MG PO TABS
20.0000 mg | ORAL_TABLET | Freq: Two times a day (BID) | ORAL | Status: DC | PRN
  Filled 2011-08-20: qty 1

## 2011-08-20 MED ORDER — METHOCARBAMOL 100 MG/ML IJ SOLN
500.0000 mg | Freq: Four times a day (QID) | INTRAMUSCULAR | Status: DC | PRN
  Administered 2011-08-21: 500 mg via INTRAVENOUS
  Filled 2011-08-20 (×3): qty 5

## 2011-08-20 MED ORDER — FENTANYL CITRATE 0.05 MG/ML IJ SOLN
25.0000 ug | INTRAMUSCULAR | Status: DC | PRN
  Administered 2011-08-20 (×2): 25 ug via INTRAVENOUS

## 2011-08-20 MED ORDER — CEFAZOLIN SODIUM 1-5 GM-% IV SOLN
INTRAVENOUS | Status: AC
  Filled 2011-08-20: qty 50

## 2011-08-20 MED ORDER — ALPRAZOLAM 0.5 MG PO TABS: 0.5000 mg | ORAL_TABLET | Freq: Four times a day (QID) | ORAL | Status: DC | PRN

## 2011-08-20 MED ORDER — 0.9 % SODIUM CHLORIDE (POUR BTL) OPTIME
TOPICAL | Status: DC | PRN
  Administered 2011-08-20: 1000 mL

## 2011-08-20 MED ORDER — OXYCODONE HCL 5 MG PO TABS
5.0000 mg | ORAL_TABLET | ORAL | Status: DC | PRN
  Administered 2011-08-21: 5 mg via ORAL
  Administered 2011-08-22 (×2): 10 mg via ORAL
  Administered 2011-08-22: 5 mg via ORAL
  Administered 2011-08-22 – 2011-08-23 (×3): 10 mg via ORAL
  Filled 2011-08-20 (×2): qty 1
  Filled 2011-08-20 (×5): qty 2

## 2011-08-20 MED ORDER — METHOCARBAMOL 500 MG PO TABS
500.0000 mg | ORAL_TABLET | Freq: Four times a day (QID) | ORAL | Status: DC | PRN
  Administered 2011-08-22 – 2011-08-23 (×4): 500 mg via ORAL
  Filled 2011-08-20 (×4): qty 1

## 2011-08-20 MED ORDER — ONDANSETRON HCL 4 MG PO TABS: 4.0000 mg | ORAL_TABLET | Freq: Four times a day (QID) | ORAL | Status: DC | PRN

## 2011-08-20 NOTE — Progress Notes (Signed)
Report given to Amber, RN 

## 2011-08-20 NOTE — Progress Notes (Signed)
PT Cancellation Note  Treatment cancelled today due to medical issues with patient which prohibited therapy Pt. had nausea all day. Deferred mobility, but pt instructed in ankle pumps, quad and glute sets.  Communicated with nursing and OR about donjoy brace for pt to use when up to support arm and decrease pulling force on neck that would be present with conventional sling Pt agrees to get out of bed tomorrow  Donnetta Hail 08/20/2011, 2:59 PM

## 2011-08-20 NOTE — Progress Notes (Signed)
Subjective: Patient is alert and oriented. She is in bed with husband at her bedside. She states she can move her fingers. She has some tingling in the index and middle finger. She notes no lower extremity pain. She has no abdominal complaints or complaints of shortness of breath. Dr. Danielle Dess has seen her for her cervical spine and made recommendations. I discussed all issues with the patient and her husband regarding her surgery. She of course is only hours out from her major right upper extremity reconstruction.  I discussed all issues with the nursing staff. Her pain is fairly well controlled at this time.  Objective: Vital signs in last 24 hours: Temp:  [97.8 F (36.6 C)-98.3 F (36.8 C)] 98.3 F (36.8 C) (02/03 0840) Pulse Rate:  [61-92] 61  (02/03 0840) Resp:  [11-21] 16  (02/03 0840) BP: (108-150)/(57-81) 109/71 mmHg (02/03 0840) SpO2:  [94 %-100 %] 95 % (02/03 0840) Weight:  [67.132 kg (148 lb)] 67.132 kg (148 lb) (02/02 2236)  Intake/Output from previous day: 02/02 0701 - 02/03 0700 In: 1550 [P.O.:50; I.V.:1500] Out: 475 [Urine:450; Blood:25] Intake/Output this shift: Total I/O In: 120 [P.O.:120] Out: -    Basename 08/19/11 2047  HGB 12.6    Basename 08/19/11 2047  WBC 15.5*  RBC 4.35  HCT 37.1  PLT 299    Basename 08/19/11 2220 08/19/11 2047  NA -- 139  K 3.4* 3.0*  CL -- 102  CO2 -- 22  BUN -- 16  CREATININE -- 1.00  GLUCOSE -- 155*  CALCIUM -- 8.7   No results found for this basename: LABPT:2,INR:2 in the last 72 hours  Physical exam: Lower extremity exam is benign. Abdomen is nontender. Her chest has equal breath sounds. Left upper extremity is nontender. Her right upper extremity has normal ulnar sensory and motor nerve function. The radial nerve is intact as demonstrated by extensor function being normal. She has poor flexion of her thumb and index finger with some subjective numbness in these digits. She is nontender with passive extension of the  fingers and thumb. There are no signs of compartment syndrome. 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  Overall she looks remarkably well given her upper extremity injury and cervical spine fracture. Assessment/Plan: We will plan for IV antibiotics, ice, physical therapy and protection of the extremity and cervical spine. I spent greater than 30 minutes with her discussing the injuries, the reconstruction, and a proposed aftermath after surgical intervention.  She will need therapy for ambulation, activities daily living and other measures. I have discussed with her the long road ahead in terms of healing. 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 Her elbow was dislocated  rather violently with fracture. Fortunately the radial nerve which I felt was more at risk given her fracture pattern and the ulnar nerve are intact. I discussed with her that we will watch the median/AIN nerves closely. I would expect a prolonged recovery however. Fortunately the median nerve was not in the field of dissection about the elbow but certainly had traction and stretch phenomenon secondary to the fracture dislocation and timeframe duration of dislocation prior to reconstruction. I discussed her all these issues in great detail.  We will continue cervical spine precautions per Dr. Danielle Dess. At present time her pain is reasonably controlled and there no immediate complicating features just hours now after her reconstruction.  Karen Chafe 08/20/2011, 10:27 AM

## 2011-08-20 NOTE — Transfer of Care (Signed)
Immediate Anesthesia Transfer of Care Note  Patient: Julie Russell  Procedure(s) Performed:  OPEN REDUCTION INTERNAL FIXATION (ORIF) ELBOW/OLECRANON FRACTURE; OPEN REDUCTION INTERNAL FIXATION (ORIF) DISTAL RADIAL FRACTURE  Patient Location: PACU  Anesthesia Type: General  Level of Consciousness: awake, alert , oriented and patient cooperative  Airway & Oxygen Therapy: Patient Spontanous Breathing and Patient connected to face mask oxygen  Post-op Assessment: Report given to PACU RN and Post -op Vital signs reviewed and stable  Post vital signs: Reviewed and stable  Complications: No apparent anesthesia complications

## 2011-08-20 NOTE — Anesthesia Postprocedure Evaluation (Signed)
Anesthesia Post Note  Patient: Julie Russell  Procedure(s) Performed:  OPEN REDUCTION INTERNAL FIXATION (ORIF) ELBOW/OLECRANON FRACTURE; OPEN REDUCTION INTERNAL FIXATION (ORIF) DISTAL RADIAL FRACTURE  Anesthesia type: General  Patient location: PACU  Post pain: Pain level controlled  Post assessment: Post-op Vital signs reviewed  Last Vitals:  Filed Vitals:   08/19/11 2236  BP: 142/81  Pulse: 67  Temp:   Resp: 18    Post vital signs: Reviewed  Level of consciousness: sedated  Complications: No apparent anesthesia complications

## 2011-08-20 NOTE — Plan of Care (Signed)
Problem: Phase II Progression Outcomes Goal: Tolerating diet Outcome: Not Progressing Pt unable to tolerate diet advance due to N

## 2011-08-20 NOTE — Op Note (Signed)
NAMEMarland Kitchen  MIYANI, CRONIC NO.:  [id number]  MEDICAL RECORD NO.:  [id number]  LOCATION:  1613                         FACILITY:  [facility]  PHYSICIAN:  Dionne Ano. Gyasi Hazzard, M.D.DATE OF BIRTH:  Aug 15, 1950  DATE OF PROCEDURE: DATE OF DISCHARGE:                              OPERATIVE REPORT   PREOPERATIVE DIAGNOSES: 1. Comminuted complex fracture and dislocation with radial head and     capsular fractures of the right elbow. 2. Impending compartment/carpal tunnel syndrome. 3. Fracture-dislocation, right wrist, comminuted in nature, greater     than 5-part.  POSTOPERATIVE DIAGNOSES: 1. Comminuted complex fracture and dislocation with radial head and     capsular fractures of the right elbow. 2. Impending compartment/carpal tunnel syndrome. 3. Fracture-dislocation, right wrist, comminuted in nature, greater     than 5-part.  PROCEDURES: 1. Open reduction internal fixation, right distal radius fracture with     DVR plate and screw construct. 2. Open carpal tunnel release, right wrist. 3. Fasciotomy, right forearm. 4. Open reduction internal fixation, comminuted complex distal humerus     fracture with [employer] distal humerus lateral plate and Acutrak     screws.  This was a comminuted intra-articular fracture with 3     capitellum fragments. 5. Open treatment radial head fracture with fragment excision and     joint debridement.  This was less than 10% of the joint surface. 6. Lateral ulnar collateral ligament reconstruction, right elbow. 7. Stress radiography. 8. Open reduction, ulnohumeral dislocation.  SURGEON:  Dionne Ano. Amanda Pea, M.D.  ASSISTANT:  Karie Chimera, PA-C.  COMPLICATIONS:  None.  ANESTHESIA:  General.  TOURNIQUET TIME:  Less than 2 hours.  INDICATIONS FOR PROCEDURE:  This patient is a pleasant female who fell down a flight of stairs today, she has cervical spine fractures as well as the above-mentioned operative injuries.  I have counseled her  and her family in regards to risks and benefits of surgery including risk of infection, bleeding, anesthesia, damage to normal structures, and failure of surgery to accomplish its intended goals of relieving symptoms and restoring function.  With this in mind, she desires to proceed.  All questions have been encouraged and answered preoperatively.  OPERATIVE PROCEDURE:  The patient was seen by myself and Anesthesia, taken to operative suite, underwent smooth induction of general anesthesia.  I should note that time-out was called, preop and postop check list complete and I discussed her care with Dr. Barnett Abu of Neurosurgery who feels she is fine in terms of being capable for intubation.  She has a C2 odontoid fracture, which appears to be somewhat stable.  We placed her in inline traction and did not manipulate the neck whatsoever with her intubation.  I assisted Dr. Rica Mast in regards to this.  Once this was complete, the right upper extremity was very carefully padded, prepped, and draped in the usual sterile fashion by myself.  I performed the scrub myself.  This was a 10-minute surgical Betadine scrub and paint.  She had preoperative antibiotics given.  Once this was done, final time-out was called.  The patient underwent reduction of the ulnohumeral joint.  The patient had a very unstable ulnar humeral joint and we went ahead  and performed a closed reduction.  She had marked instability and it was certainly noted that we would have to perform attempts at reconstruction of the lateral aspect of the arm to try to give her some degree of stability.  Following this, we then turned attention towards the wrist.  I performed a provisional reduction followed by insufflation of the tourniquet and a volar radial approach to the wrist.  Dissection was carried down.  FCR tendon sheath was incised dorsally and palmarly.  Once this was done, I performed a fasciotomy of the forearm given  the fact that the patient had severe injuries and what I felt would be too much pressure on the muscles.  Thus, a deep and superficial fasciotomy of the forearm structures was accomplished.  Following this, the pronator was incised, elevated ulnarly and I then performed a reduction followed by placement of a DVR plate and screw construct from [employer].  I meticulously checked the lengths and made sure we had good stability.  I used a combination of partially-threaded and smooth screws and was able to achieve excellent purchase.  Once this was done, I placed the patient through a full range of motion about the wrist, all looked well.  Screw lengths looked perfect.  Radial height, inclination, volar tilt, and stability were all nicely obtained.  Once this was done, I deflated the tourniquet, obtained hemostasis by electrocautery, closed the pronator with 3-0 Vicryl followed by closure of the skin edge with vertical mattress suture of 4-0 Prolene.  Following this deflation time, I reinflated the tourniquet and made a 1 inch incision over the distal edge of the transcarpal ligament. Dissection was carried down, palmar fascia incised and the distal edge was released without difficulty followed by distal to proximal dissection and release under direct vision.  4.0 loupe magnification was used.  I made sure the patient had a complete open carpal tunnel release without difficulty through the separate incision.  Following this, we irrigated copiously and closed the carpal tunnel site.  Once this was done, we then placed a modified splint on the patient so as not to manipulate the wrist during the elbow portion of the procedure.  The patient had the tourniquet insufflated about the elbow.  A lateral incision was made and this was extended into a Kocher incision.  Once this was done,  I noted the lateral ulnar collateral ligament injury as well as the comminuted fracture.  At this time, I  basically created the interval, which was already made for me.  I noted a free capitellum loose body, and injury to the radial head.  The radial head was treated with debridement.  This was less than 10%, but the patient did certainly have a radial head fracture, which required the debridement and treatment.  Following this, I then performed removal of loose bodies in the elbow joint.  The patient had multiple loose bodies.  I had to make a counterincision medially to remove one of the rather stout loose bodies. Once this was done, we then turned attention towards the capitellum. Provisional fixation with K-wires was accomplished followed by placement of 2 micro Acutrak screws.  Once this was done, the patient then had a DePuy lateral column plate placed.  This was done without difficulty.  Thus, radial head fracture treatment as well as ORIF, capitellum free- fragment and intra-articular distal humerus fracture, comminuted nature was accomplished.  Once this was done, I then performed a stress radiography, and all looked quite well.  I was pleased with the findings.  The patient had no complicating features.  She had a smooth arc of motion and good stability.  Following this, I performed a lateral ulnar collateral ligament reconstruction repair with FiberWire suture.  The patient tolerated this well without difficulty.  This was done with an imbricating technique.  I should note that I was quite pleased with all portions of the procedure.  There were absolutely no complications and I was able to achieve good stability about the wrist and the elbow.  Once this was done, the tourniquet was deflated at less than 2 hours.  Irrigation was applied.  The fracture interval was closed.  I did palpate the radial nerve and stayed away from it at all points during the case.  Once this was complete, I then performed a closure of the wound with 3-0 Vicryl in the subcu followed by skin clips at  the skin edge.  She was lavaged once again as she during multiple points of the procedure.  Adaptic, Xeroform, gauze, Webril, and a long-arm splint with stirrups were applied.  The patient tolerated this well and no complicating features.  She had excellent refills in all compartments and no complicating features.  We will monitor condition in the recovery room.  We will plan for IV antibiotics, general postoperative observation, and close attention to detail.  This is a very significant fracture.  I would plan for immobilization x2 weeks.  Following this, I want to get her in a long-arm splint and begin well arm assisted range of motion for the elbow, nothing heavy, just light motion to prevent periarticular arthrofibrosis.  I will keep her also in a removable brace/Fisher type brace after the operation to protect the wrist.  I will begin wrist range of motion at 5 weeks, strengthening at 8-10 weeks.  We simply want to go slow and steady with her elbow motion and I would expect some stiffness after the surgical intervention given the horrendous nature of her fracture.  These notes have been discussed, all questions encouraged and answered.     Dionne Ano. Amanda Pea, M.D.     [facility]  D:  08/20/2011  T:  08/20/2011  Job:  [id number]

## 2011-08-20 NOTE — Brief Op Note (Signed)
08/19/2011 - 08/20/2011  3:01 AM  PATIENT:  Julie Russell  61 y.o. female  PRE-OPERATIVE DIAGNOSIS:  fracture right elbow and wrist with dislocation about the elbow and wrist  POST-OPERATIVE DIAGNOSIS:  fracture right elbow and wrist with dislocation about the elbow and wrist  PROCEDURE:   #1Right wrist open reduction internal fixation distal radius fracture with DVR plate and screws  #2 open carpal tunnel release right wrist  #3 fasciotomy right forearm  #4 open reduction internal fixation comminuted complex distal humerus fracture intra-articular in nature with capitellum free fragment   #5 open treatment radial head fracture with fragment excision- less than 10% of the surface in nature   #6 lateral ulna collateral ligament reconstruction right elbow  #7 stress radiography   #8 open reduction on the humeral dislocation SURGEON:  Surgeon(s): Karen Chafe, MD  PHYSICIAN ASSISTANT: Karie Chimera PA-C  ASSISTANTS: Same   ANESTHESIA:   general  EBL:  Total I/O In: 1400 [I.V.:1400] Out: 275 [Urine:250; Blood:25]  BLOOD ADMINISTERED:none  DRAINS: none   LOCAL MEDICATIONS USED:  NONE  SPECIMEN:  No Specimen  DISPOSITION OF SPECIMEN:  N/A  COUNTS:  YES  TOURNIQUET:   Total Tourniquet Time Documented: Upper Arm (Right) - -591 minutes  DICTATION: .Other Dictation: Dictation Number [id number]  PLAN OF CARE: Admit to inpatient   PATIENT DISPOSITION:  PACU - hemodynamically stable.   Delay start of Pharmacological VTE agent (>24hrs) due to surgical blood loss or risk of bleeding:  {YES/NO/NOT APPLICABLE:20182

## 2011-08-21 NOTE — Progress Notes (Signed)
Physical Therapy Treatment Patient Details Name: Julie Russell MRN: [id number] DOB: 12/28/1950 Today's Date: 08/21/2011  [id number] G  PT Assessment/Plan  PT - Assessment/Plan Comments on Treatment Session: Pt ambulated to and from restroom at hand held assist.  Noted some instability/imbalance with ambulation.  Spoke with pt/husband about quad cane use to increase stability.  Will trial quad cane tomorrow.   PT Plan: Discharge plan remains appropriate PT Frequency: Min 5X/week Follow Up Recommendations: Home health PT Equipment Recommended: 3 in 1 bedside comode PT Goals  Acute Rehab PT Goals PT Goal Formulation: With patient Time For Goal Achievement: 2 weeks Pt will go Supine/Side to Sit: with supervision PT Goal: Supine/Side to Sit - Progress: Progressing toward goal Pt will go Sit to Supine/Side: with supervision PT Goal: Sit to Supine/Side - Progress: Progressing toward goal Pt will go Sit to Stand: with modified independence PT Goal: Sit to Stand - Progress: Progressing toward goal Pt will go Stand to Sit: with modified independence PT Goal: Stand to Sit - Progress: Progressing toward goal Pt will Ambulate: 51 - 150 feet;with modified independence;with least restrictive assistive device PT Goal: Ambulate - Progress: Progressing toward goal Pt will Go Up / Down Stairs: Flight;with modified independence;with least restrictive assistive device PT Goal: Up/Down Stairs - Progress: Goal set today Pt will Perform Home Exercise Program: with supervision, verbal cues required/provided PT Goal: Perform Home Exercise Program - Progress: Goal set today  PT Treatment Precautions/Restrictions  Precautions Required Braces or Orthoses: Yes Cervical Brace: Hard collar Restrictions Weight Bearing Restrictions: Yes RUE Weight Bearing: Non weight bearing Other Position/Activity Restrictions: regular sling on for transfers OOB but the ok to have sling off once sitting in chair for proper  positioning. Mobility (including Balance) Bed Mobility Bed Mobility: Yes Rolling Left: 4: Min assist Rolling Left Details (indicate cue type and reason): Min/guard for neck support.   Left Sidelying to Sit: 4: Min assist;3: Mod assist;HOB flat;With rails Left Sidelying to Sit Details (indicate cue type and reason): Pt requires increased assist due to increased anxiety with movement.  Husband assists pts neck.  cues for LE management.  Sit to Sidelying Left: 4: Min assist;3: Mod assist Sit to Sidelying Left Details (indicate cue type and reason): Requires assist for trunk into bed with husband supporting neck.  Cues for LE management.  Transfers Transfers: Yes Sit to Stand: 4: Min assist;With upper extremity assist;From bed;From chair/3-in-1;With armrests Sit to Stand Details (indicate cue type and reason): Min/guard for safety/steadying.  Cues for LUE hand placement on bed.  (Performed x 2 from bed and from 3in1. ) Stand to Sit: 4: Min assist;With upper extremity assist;To bed Stand to Sit Details: Performed x 2 to 3in1 and to bed.  Cues for hand placement.  Ambulation/Gait Ambulation/Gait: Yes Ambulation/Gait Assistance: 4: Min assist Ambulation/Gait Assistance Details (indicate cue type and reason): Requires hand held assist for steadying.  Husband assisted with IV pole.   Ambulation Distance (Feet): 15 Feet Assistive device: 1 person hand held assist Gait Pattern: Step-through pattern;Decreased stride length Gait velocity: decreased Stairs: No Wheelchair Mobility Wheelchair Mobility: No  Posture/Postural Control Posture/Postural Control: No significant limitations Exercise    End of Session PT - End of Session Equipment Utilized During Treatment: Gait belt;Cervical collar Activity Tolerance: Patient limited by pain Patient left: in bed;with call bell in reach;with family/visitor present Nurse Communication: Other (comment) (Communicated w/ RN about sling  placement) General Behavior During Session: [facility] for tasks performed Cognition: [facility] for tasks performed  Page, Irving Burton  Ann 08/21/2011, 3:51 PM

## 2011-08-21 NOTE — Evaluation (Signed)
Occupational Therapy Evaluation Patient Details Name: Julie Russell MRN: [id number] DOB: 1950-12-14 Today's Date: 08/21/2011 Time in: 8:55 am Time out: 9:25 am Eval II  Problem List: There is no problem list on file for this patient.   Past Medical History: History reviewed. No pertinent past medical history. Past Surgical History: No past surgical history on file.  OT Assessment/Plan/Recommendation OT Assessment Clinical Impression Statement: Pt will benefit from skilled OT services to increase her independence with ADL for goal of returning  home with husband who is available 24/7. OT Recommendation/Assessment: Patient will need skilled OT in the acute care venue OT Problem List: Decreased strength;Decreased range of motion;Decreased activity tolerance;Decreased knowledge of use of DME or AE;Pain;Increased edema;Decreased knowledge of precautions OT Therapy Diagnosis : Generalized weakness;Acute pain OT Plan OT Frequency: Min 2X/week OT Treatment/Interventions: Self-care/ADL training;Therapeutic activities;DME and/or AE instruction;Patient/family education OT Recommendation Follow Up Recommendations: Home health OT Equipment Recommended: 3 in 1 bedside comode Individuals Consulted Consulted and Agree with Results and Recommendations: Family member/caregiver;Patient Family Member Consulted: spouse OT Goals Acute Rehab OT Goals OT Goal Formulation: With patient/family Time For Goal Achievement: 7 days ADL Goals Pt Will Perform Grooming: with supervision;Standing at sink ADL Goal: Grooming - Progress: Goal set today Pt Will Perform Lower Body Bathing: with supervision;Sit to stand from chair;Sit to stand from bed ADL Goal: Lower Body Bathing - Progress: Goal set today Pt Will Perform Lower Body Dressing: with supervision;Sit to stand from bed;Sit to stand from chair ADL Goal: Lower Body Dressing - Progress: Goal set today Pt Will Transfer to Toilet: with  supervision;Ambulation;with DME;3-in-1 ADL Goal: Toilet Transfer - Progress: Goal set today Pt Will Perform Toileting - Clothing Manipulation: with supervision;Standing ADL Goal: Toileting - Clothing Manipulation - Progress: Goal set today Pt Will Perform Toileting - Hygiene: with supervision;Sit to stand from 3-in-1/toilet ADL Goal: Toileting - Hygiene - Progress: Goal set today Additional ADL Goal #1: pt/spouse will be independent with UB dressing including don/doff sling properly. ADL Goal: Additional Goal #1 - Progress: Goal set today  OT Evaluation Precautions/Restrictions  Precautions Required Braces or Orthoses: Yes Cervical Brace: Hard collar;Other (comment) Restrictions Weight Bearing Restrictions: Yes RUE Weight Bearing: Non weight bearing Other Position/Activity Restrictions: Pt must have regular sling on for transfers OOB but may have sling off once sitting in chair for proper positioning. Prior Functioning Home Living Lives With: Spouse Receives Help From: Family Type of Home: House Home Layout: Two level Alternate Level Stairs-Rails:  (two rails half way, then one handle on left) Alternate Level Stairs-Number of Steps: 12 Home Access: Stairs to enter Entrance Stairs-Rails: None Entrance Stairs-Number of Steps: 2 Bathroom Shower/Tub: Health visitor: Standard Home Adaptive Equipment: Shower chair with back Prior Function Level of Independence: Independent with basic ADLs;Independent with homemaking with ambulation;Independent with homemaking with wheelchair;Independent with gait;Independent with transfers Driving: Yes ADL ADL Grooming: Simulated;Wash/dry face;Minimal assistance Where Assessed - Grooming: Supported;Supine, head of bed up Upper Body Bathing: Simulated;Chest;Right arm;Left arm;Abdomen;Moderate assistance Where Assessed - Upper Body Bathing: Unsupported;Sitting, bed Lower Body Bathing: Not assessed Upper Body Dressing: Simulated;+1  Total assistance Upper Body Dressing Details (indicate cue type and reason): total assist with sling don/doff. pt dizzy and with 5/10 pain. Where Assessed - Upper Body Dressing: Sitting, bed;Unsupported Lower Body Dressing: Not assessed Toilet Transfer: Not assessed Toilet Transfer Method: Not assessed Toileting - Clothing Manipulation: Not assessed Toileting - Hygiene: Not assessed Tub/Shower Transfer: Not assessed Tub/Shower Transfer Method: Not assessed ADL Comments: Husband present. Once patient at EOB  OT attempted to don Roe Coombs joy sling but would not fit around bandages/splint. Sat EOB about 5 minutes and pt reporting dizziness initially but better after about a minute. Return patient to supine as OT/PT unable to get adequate fit of sling for OOB activity at this time. After session, Dr Amanda Pea gave clarification over telephone that it is ok to use blue sling for OOB activity but then ok to remove sling while patient up in chair. Will reattempt later this am with PT for OOB with blue sling instead. Vision/Perception    Cognition Cognition Arousal/Alertness: Awake/alert Overall Cognitive Status: Appears within functional limits for tasks assessed Orientation Level: Oriented X4 Sensation/Coordination Sensation Light Touch: Appears Intact Coordination Gross Motor Movements are Fluid and Coordinated: Yes Extremity Assessment RUE Assessment RUE Assessment: Exceptions to [facility] RUE AROM (degrees) RUE Overall AROM Comments: pt needs assist to lift R UE to elevate on pillow but patient helps using left UE. Pt in splint from above elbow to hand. Encouraged her to move digits. Note edema and pt having difficulty actively flexing digits fully. Encouraged her to use left hand to assist in increasing flexion and extension. LUE Assessment LUE Assessment: Within Functional Limits Mobility  Bed Mobility Bed Mobility: Yes Rolling Left: 1: +2 Total assist;Patient percentage (comment) Rolling Left  Details (indicate cue type and reason): pt 90% for safety and support for neck. Min verbal cues for log roll technique.  Left Sidelying to Sit: 1: +2 Total assist;Patient percentage (comment) Left Sidelying to Sit Details (indicate cue type and reason): pt 70%. cues for hand placement and physical assist for supporting neck.  Sit to Sidelying Left: 1: +2 Total assist;Patient percentage (comment) Sit to Sidelying Left Details (indicate cue type and reason): pt 70% Exercises   End of Session OT - End of Session Equipment Utilized During Treatment: Gait belt Activity Tolerance: Other (comment) (pt dizzy at EOB but better after about a minute) Patient left: in bed;with call bell in reach;with family/visitor present General Behavior During Session: [facility] for tasks performed Cognition: [facility] for tasks performed   Lennox Laity [phone] 08/21/2011, 12:46 PM

## 2011-08-21 NOTE — Progress Notes (Signed)
Patient ID: Julie Russell, female   DOB: [birth year], 61 y.o.   MRN: [id number]  Subjective: Patient is doing fairly well, main complaint is neck and posterior head. No HA, nausea or vomiting. Denies numbness or tingling to upper extremity. Participated with therapy today. Tolerating meds well.  Objective: Vital signs in last 24 hours: Temp:  [98.7 F (37.1 C)-99.2 F (37.3 C)] 99.2 F (37.3 C) (02/04 0520) Pulse Rate:  [68-80] 76  (02/04 0520) Resp:  [16-20] 20  (02/04 0520) BP: (97-113)/(62-74) 113/74 mmHg (02/04 0520) SpO2:  [92 %-96 %] 92 % (02/04 0520)  Intake/Output from previous day: 02/03 0701 - 02/04 0700 In: 1335 [P.O.:245; I.V.:990; IV Piggyback:100] Out: -  Intake/Output this shift:     Basename 08/19/11 2047  HGB 12.6    Basename 08/19/11 2047  WBC 15.5*  RBC 4.35  HCT 37.1  PLT 299    Basename 08/19/11 2220 08/19/11 2047  NA -- 139  K 3.4* 3.0*  CL -- 102  CO2 -- 22  BUN -- 16  CREATININE -- 1.00  GLUCOSE -- 155*  CALCIUM -- 8.7   No results found for this basename: LABPT:2,INR:2 in the last 72 hours  Alert and oriented, NAD HEENT: C-Collar in place, EOM intact Chest; equal expansions, respirations non-labored RUE: Splint clean and dry, sensation and refill intact, no signs of infection present, anterior interosseous nerve deficit present, ulnar and radial nerve intact  Assessment/Plan: S/P ORIF R distal radius fracture and ORIF R lateral condyle fracture We will continue attempts at mobilization, D/C foley, cont pain management  Mehr Depaoli L 08/21/2011, 1:09 PM

## 2011-08-21 NOTE — Progress Notes (Signed)
Occupational Therapy Treatment Patient Details Name: Julie Russell MRN: [id number] DOB: 1950/07/20 Today's Date: 08/21/2011 Time in: 11:37 am Time out: 11:54 am 1TA  OT Assessment/Plan OT Assessment/Plan Comments on Treatment Session: Pt did well with transfer to chair and agreeable to try to sit up for at least 30 minutes. Husband presenst. pt motivated to continue to work on goals. OT Plan: Discharge plan remains appropriate OT Frequency: Min 2X/week Follow Up Recommendations: Home health OT Equipment Recommended: 3 in 1 bedside comode OT Goals Acute Rehab OT Goals OT Goal Formulation: With patient/family Time For Goal Achievement: 7 days ADL Goals Pt Will Perform Grooming: with supervision;Standing at sink ADL Goal: Grooming - Progress: Goal set today Pt Will Perform Lower Body Bathing: with supervision;Sit to stand from chair;Sit to stand from bed ADL Goal: Lower Body Bathing - Progress: Goal set today ADL Goal: Upper Body Dressing - Progress: Progressing toward goals Pt Will Perform Lower Body Dressing: with supervision;Sit to stand from bed;Sit to stand from chair ADL Goal: Lower Body Dressing - Progress: Goal set today Pt Will Transfer to Toilet: with supervision;Ambulation;with DME;3-in-1 ADL Goal: Toilet Transfer - Progress: Progressing toward goals Pt Will Perform Toileting - Clothing Manipulation: with supervision;Standing ADL Goal: Toileting - Clothing Manipulation - Progress: Progressing toward goals Pt Will Perform Toileting - Hygiene: with supervision;Sit to stand from 3-in-1/toilet ADL Goal: Toileting - Hygiene - Progress: Goal set today Additional ADL Goal #1: pt/spouse will be independent with UB dressing including don/doff sling properly. ADL Goal: Additional Goal #1 - Progress: Progressing toward goals  OT Treatment Precautions/Restrictions  Precautions Required Braces or Orthoses: Yes Cervical Brace: Hard collar Restrictions Weight Bearing  Restrictions: Yes RUE Weight Bearing: Non weight bearing Other Position/Activity Restrictions: regular sling on for transfers OOB but then ok to have sling off once sitting in chair for proper positioning. Sensation Light Touch: Appears Intact Coordination Gross Motor Movements are Fluid and Coordinated: Yes ADL ADL Upper Body Dressing: Simulated;+1 Total assistance Upper Body Dressing Details (indicate cue type and reason): total assist with blue sling Where Assessed - Upper Body Dressing: Sitting, bed;Unsupported Toilet Transfer: Simulated;+2 Total assistance;Comment for patient % Toilet Transfer Details (indicate cue type and reason): stand step pivot about 4-5 steps to chair. pt 90% +2 for safety for first time up Toilet Transfer Method: Stand pivot Toileting - Clothing Manipulation: Simulated;+2 Total assistance;Comment for patient % Toileting - Clothing Manipulation Details (indicate cue type and reason): pt 90% to adjust gown Where Assessed - Glass blower/designer Manipulation: Standing Toileting - Hygiene: Not assessed Tub/Shower Transfer: Not assessed Tub/Shower Transfer Method: Not assessed ADL Comments: Pt up to chair with PT/OT using blue sling on right UE. Removed once patient in chair. Made sign and posted over bed to inform nsg to make sure sling is on when pt transfers OOB but ok to remove once in chair. Also verbally informed nursing of this information as well. Elevated UE on 2 pillows and encouraged pt to continue to flex/extend digits to decrease edema and increase ROM.  Mobility  Bed Mobility Bed Mobility: Yes Rolling Left: 1: +2 Total assist;Patient percentage (comment) Rolling Left Details (indicate cue type and reason): pt 90% with support for neck and verbal cues for technique Left Sidelying to Sit: 1: +2 Total assist;Patient percentage (comment);With rails Left Sidelying to Sit Details (indicate cue type and reason): pt 70% Sit to Sidelying Left: 1: +2 Total  assist;Patient percentage (comment) Sit to Sidelying Left Details (indicate cue type and reason): pt 70% Transfers Sit  to Stand: 1: +2 Total assist;Patient percentage (comment);From bed Sit to Stand Details (indicate cue type and reason): pt 90% with +2 for safety to steady and cues for hand placement. Stand to Sit: 1: +2 Total assist;Patient percentage (comment);To chair/3-in-1 Stand to Sit Details: pt 90% for safety with cues for safe descent to chair and hand placement. Exercises    End of Session OT - End of Session Equipment Utilized During Treatment: Gait belt Activity Tolerance: Patient tolerated treatment well Patient left: in chair;with call bell in reach;with family/visitor present Nurse Communication: Mobility status for transfers General Behavior During Session: [facility] for tasks performed Cognition: [facility] for tasks performed  Lennox Laity  [phone] 08/21/2011, 1:03 PM

## 2011-08-21 NOTE — Progress Notes (Signed)
Physical Therapy Treatment Patient Details Name: Julie Russell MRN: [id number] DOB: 06/05/1951 Today's Date: 08/21/2011  [id number] TA  PT Assessment/Plan  PT - Assessment/Plan Comments on Treatment Session: Pt able to ambulate from bed to chair with RUE sling to support arm.  +2 assist given for all mobility for safety, however pt doing well.  Will see again in pm to ambulate in order to determine assist level and if a device is needed.  PT Plan: Discharge plan remains appropriate PT Frequency: Min 5X/week Follow Up Recommendations: Home health PT Equipment Recommended: Other (comment) (To be determined upon ambulation) PT Goals  Acute Rehab PT Goals PT Goal Formulation: With patient Time For Goal Achievement: 2 weeks Pt will go Supine/Side to Sit: with supervision PT Goal: Supine/Side to Sit - Progress: Progressing toward goal Pt will go Sit to Supine/Side: with supervision PT Goal: Sit to Supine/Side - Progress: Goal set today Pt will go Sit to Stand: with modified independence PT Goal: Sit to Stand - Progress: Progressing toward goal Pt will go Stand to Sit: with modified independence PT Goal: Stand to Sit - Progress: Progressing toward goal Pt will Ambulate: 51 - 150 feet;with modified independence;with least restrictive assistive device PT Goal: Ambulate - Progress: Goal set today Pt will Go Up / Down Stairs: Flight;with modified independence;with least restrictive assistive device PT Goal: Up/Down Stairs - Progress: Goal set today Pt will Perform Home Exercise Program: with supervision, verbal cues required/provided PT Goal: Perform Home Exercise Program - Progress: Goal set today  PT Treatment Precautions/Restrictions  Precautions Required Braces or Orthoses: Yes Cervical Brace: Hard collar;Other (comment) Restrictions Weight Bearing Restrictions: Yes RUE Weight Bearing: Non weight bearing Other Position/Activity Restrictions: Pt must have regular sling on for  transfers OOB but may have sling off once sitting in chair for proper positioning. Mobility (including Balance) Bed Mobility Bed Mobility: Yes Rolling Left: 1: +2 Total assist;Patient percentage (comment) Rolling Left Details (indicate cue type and reason): Pt assist 90-100%. +2 assist for safety, with only guarding for safety and to support neck. cues for log rolling to ensure safety at cervical spine.  Left Sidelying to Sit: 1: +2 Total assist;Patient percentage (comment) Left Sidelying to Sit Details (indicate cue type and reason): Pt assist 70%. +2 assist for safety, with cues for hand placement and LE management off of bed. Assist given to support neck.  Transfers Transfers: Yes Sit to Stand: 1: +2 Total assist;Patient percentage (comment);With upper extremity assist;From bed Sit to Stand Details (indicate cue type and reason): Pt assist 90%.  +2 for safety/steadying with cues for LUE placement on bed. Stand to Sit: 1: +2 Total assist;Patient percentage (comment);With armrests;With upper extremity assist;To chair/3-in-1 Stand to Sit Details: Pt assist 90%.  +2 for safety/controlled descent with cues for LUE placement on arm rest before sitting.   Stand Pivot Transfers: 1: +2 Total assist;Patient percentage (comment) Stand Pivot Transfer Details (indicate cue type and reason): Took 3-4 steps from bed to chair.  Pt assist 90%. +2 assist for safety/steadying.  Cues for technique.  Ambulation/Gait Ambulation/Gait: No Stairs: No Wheelchair Mobility Wheelchair Mobility: No  Posture/Postural Control Posture/Postural Control: No significant limitations Balance Balance Assessed: Yes Static Sitting Balance Static Sitting - Balance Support: No upper extremity supported;Feet supported Static Sitting - Level of Assistance: 5: Stand by assistance Static Sitting - Comment/# of Minutes: Pt able to tolerate sitting EOB approx 5 mins with c/o of dizziness/spinning initially that improved with upright  posture.   Exercise    End  of Session PT - End of Session Equipment Utilized During Treatment: Gait belt;Cervical collar Activity Tolerance: Patient limited by pain Patient left: in chair;with call bell in reach;with family/visitor present Nurse Communication: Mobility status for transfers;Mobility status for ambulation General Behavior During Session: [facility] for tasks performed Cognition: [facility] for tasks performed  Page, Meribeth Mattes 08/21/2011, 12:27 PM

## 2011-08-21 NOTE — Evaluation (Signed)
Physical Therapy Evaluation Patient Details Name: Julie Russell MRN: [id number] DOB: Jan 04, 1951 Today's Date: 08/21/2011  [phone] EVII  Problem List: There is no problem list on file for this patient.   Past Medical History: History reviewed. No pertinent past medical history. Past Surgical History: No past surgical history on file.  PT Assessment/Plan/Recommendation PT Assessment Clinical Impression Statement: Pt presents s/p ORIF at elbow/wrist with C2 fracture from a fall.  Pt with decreased mobility and c/o soreness in back of neck.  Co-treatment with OT.  Pt will benefit from skilled PT in acute venue in order to address deficits.  PT recommends HHPT for follow up as husband states he can provide 24/7 care for pt.  Will continue to assess ambulation in order to determine if any assistive device will be needed for ambulation.  PT Recommendation/Assessment: Patient will need skilled PT in the acute care venue PT Problem List: Decreased balance;Decreased mobility;Decreased knowledge of use of DME;Pain;Decreased strength;Decreased range of motion Problem List Comments: Decreased strength and ROM in UE Barriers to Discharge: None PT Therapy Diagnosis : Difficulty walking;Generalized weakness;Acute pain;Abnormality of gait PT Plan PT Frequency: Min 5X/week PT Treatment/Interventions: DME instruction;Gait training;Stair training;Functional mobility training;Therapeutic activities;Therapeutic exercise;Balance training;Patient/family education PT Recommendation Follow Up Recommendations: Home health PT Equipment Recommended:  (to be determined upon pt ambulating. ) PT Goals  Acute Rehab PT Goals PT Goal Formulation: With patient Time For Goal Achievement: 2 weeks Pt will go Supine/Side to Sit: with supervision PT Goal: Supine/Side to Sit - Progress: Goal set today Pt will go Sit to Supine/Side: with supervision PT Goal: Sit to Supine/Side - Progress: Goal set today Pt will go Sit to  Stand: with modified independence PT Goal: Sit to Stand - Progress: Goal set today Pt will go Stand to Sit: with modified independence PT Goal: Stand to Sit - Progress: Goal set today Pt will Ambulate: 51 - 150 feet;with modified independence;with least restrictive assistive device PT Goal: Ambulate - Progress: Goal set today Pt will Go Up / Down Stairs: Flight;with modified independence;with least restrictive assistive device PT Goal: Up/Down Stairs - Progress: Goal set today Pt will Perform Home Exercise Program: with supervision, verbal cues required/provided PT Goal: Perform Home Exercise Program - Progress: Goal set today  PT Evaluation Precautions/Restrictions  Precautions Required Braces or Orthoses: Yes Cervical Brace: Hard collar;Other (comment) (Aspen collar on at all times) Restrictions Weight Bearing Restrictions: Yes RUE Weight Bearing: Non weight bearing Other Position/Activity Restrictions: Pt must have regular sling on for transfers OOB but may have sling off once sitting in chair for proper positioning. Prior Functioning  Home Living Lives With: Spouse Receives Help From: Family Type of Home: House Home Layout: Two level Alternate Level Stairs-Rails:  (two rails half way, then one handle on left) Alternate Level Stairs-Number of Steps: 12 Home Access: Stairs to enter Entrance Stairs-Rails: None Entrance Stairs-Number of Steps: 2 Bathroom Shower/Tub: Health visitor: Standard Home Adaptive Equipment: Shower chair with back Prior Function Level of Independence: Independent with basic ADLs;Independent with homemaking with ambulation;Independent with homemaking with wheelchair;Independent with gait;Independent with transfers Driving: Yes Cognition Cognition Arousal/Alertness: Awake/alert Overall Cognitive Status: Appears within functional limits for tasks assessed Orientation Level: Oriented X4 Sensation/Coordination Sensation Light Touch:  Appears Intact Coordination Gross Motor Movements are Fluid and Coordinated: Yes Extremity Assessment RLE Assessment RLE Assessment: Within Functional Limits LLE Assessment LLE Assessment: Within Functional Limits Mobility (including Balance) Bed Mobility Bed Mobility: Yes Rolling Left: 1: +2 Total assist;Patient percentage (comment) Rolling Left Details (  indicate cue type and reason): Pt assist 90-100%.  +2 assist for safety, with only guarding for safety and to support neck.  cues for log rolling to ensure safety at cervical spine.   Left Sidelying to Sit: 1: +2 Total assist;Patient percentage (comment) Left Sidelying to Sit Details (indicate cue type and reason): Pt assist 70%.  +2 assist for safety, with cues for hand placement and LE management off of bed.  Assist given to support neck.   Sit to Sidelying Left: 1: +2 Total assist;Patient percentage (comment) Sit to Sidelying Left Details (indicate cue type and reason): Pt assist 70%.  +2 for safety with cues for LE management and hand placement. Assist given to support neck.  Transfers Transfers: No (deferred during first visit to clarify orders for sling) Ambulation/Gait Ambulation/Gait: No (Deferred during first visit to clarify orders for sling) Stairs: No Wheelchair Mobility Wheelchair Mobility: No  Posture/Postural Control Posture/Postural Control: No significant limitations Balance Balance Assessed: Yes Static Sitting Balance Static Sitting - Balance Support: No upper extremity supported;Feet supported Static Sitting - Level of Assistance: 5: Stand by assistance Static Sitting - Comment/# of Minutes: Pt able to tolerate sitting EOB approx 5 mins with c/o of dizziness/spinning initially that improved with upright posture.   Exercise    End of Session PT - End of Session Equipment Utilized During Treatment: Gait belt;Cervical collar (Sling for RUE) Activity Tolerance: Patient limited by pain Patient left: in chair;with  call bell in reach;with family/visitor present Nurse Communication: Mobility status for transfers;Mobility status for ambulation General Behavior During Session: [facility] for tasks performed Cognition: [facility] for tasks performed  Page, Meribeth Mattes 08/21/2011, 12:18 PM

## 2011-08-22 NOTE — Progress Notes (Signed)
Subjective: 3 Days Post-Op Procedure(s) (LRB): OPEN REDUCTION INTERNAL FIXATION (ORIF) ELBOW/OLECRANON FRACTURE (Right) OPEN REDUCTION INTERNAL FIXATION (ORIF) DISTAL RADIAL FRACTURE (Right) Patient reports pain as mild.  The patient reports that she has improvement in the in her hand. She still has some numbness in the median nerve distribution. Overall she's made a dramatic improvement. The patient is tolerating her diet well. She has no significant new complaints. She still has pain in her neck region.  She is status post reconstruction of a fracture dislocation to her elbow and fracture dislocation to her risk right upper extremity. Her odontoid fracture is being treated conservatively by Dr. Danielle Dess. She feels ready to possibly go home tomorrow.  Objective: Vital signs in last 24 hours: Temp:  [98.6 F (37 C)-99.2 F (37.3 C)] 99.2 F (37.3 C) (02/05 1420) Pulse Rate:  [64-78] 72  (02/05 1420) Resp:  [15-17] 17  (02/05 1420) BP: (122-129)/(72-82) 123/74 mmHg (02/05 1420) SpO2:  [88 %-98 %] 92 % (02/05 1420)  Intake/Output from previous day: 02/04 0701 - 02/05 0700 In: 1430 [P.O.:920; I.V.:410; IV Piggyback:100] Out: 1500 [Urine:1500] Intake/Output this shift:     Basename 08/19/11 2047  HGB 12.6    Basename 08/19/11 2047  WBC 15.5*  RBC 4.35  HCT 37.1  PLT 299    Basename 08/19/11 2220 08/19/11 2047  NA -- 139  K 3.4* 3.0*  CL -- 102  CO2 -- 22  BUN -- 16  CREATININE -- 1.00  GLUCOSE -- 155*  CALCIUM -- 8.7   No results found for this basename: LABPT:2,INR:2 in the last 72 hours Physical exam: ABD soft Intact pulses distally No cellulitis present Compartment soft No signs of infection dystrophy or compartment syndrome. Her c-collar is in place her HEENT examination is stable. Lower extremity examination is benign no evidence of DVT. She has excellent all nerve and radial nerve function. Her median nerve is improved in terms of the anterior interosseous nerve  function. She is tolerating her diet well Assessment/Plan: 3 Days Post-Op Procedure(s) (LRB): OPEN REDUCTION INTERNAL FIXATION (ORIF) ELBOW/OLECRANON FRACTURE (Right) OPEN REDUCTION INTERNAL FIXATION (ORIF) DISTAL RADIAL FRACTURE (Right) Plan for discharge tomorrow  I discussed with the patient all issues as well as proper followup in 14 days. I discussed her that we will plan for rehabilitation at our office. At present time a she's made remarkable recovery for the most part. This is devastating injury and one in which she will require a lot of rehabilitation in terms of her recovery. We will plan for discharge tomorrow if cleared by physical therapy.  Karen Chafe 08/22/2011, 5:03 PM

## 2011-08-22 NOTE — Progress Notes (Signed)
Physical Therapy Treatment Patient Details Name: Julie Russell MRN: [id number] DOB: 04/12/1951 Today's Date: 08/22/2011  PT Assessment/Plan  PT - Assessment/Plan Comments on Treatment Session: see amb for notes regarding cane; pt doing well today; c/o neck pain, was premedicated PT Plan: Discharge plan remains appropriate PT Frequency: Min 5X/week Follow Up Recommendations: Home health PT Equipment Recommended: None recommended by PT PT Goals  Acute Rehab PT Goals Pt will go Supine/Side to Sit: with supervision PT Goal: Supine/Side to Sit - Progress: Progressing toward goal Pt will go Sit to Supine/Side: with supervision PT Goal: Sit to Supine/Side - Progress: Progressing toward goal Pt will go Sit to Stand: with modified independence PT Goal: Sit to Stand - Progress: Progressing toward goal Pt will go Stand to Sit: with modified independence PT Goal: Stand to Sit - Progress: Progressing toward goal Pt will Ambulate: 51 - 150 feet;with modified independence;with least restrictive assistive device PT Goal: Ambulate - Progress: Progressing toward goal Pt will Go Up / Down Stairs: Flight;with modified independence;with least restrictive assistive device PT Goal: Up/Down Stairs - Progress: Progressing toward goal  PT Treatment Precautions/Restrictions  Precautions Precautions: Fall Required Braces or Orthoses: Yes Cervical Brace: Hard collar Restrictions Weight Bearing Restrictions: Yes RUE Weight Bearing: Non weight bearing Other Position/Activity Restrictions: regular sling on for transfers OOB but the ok to have sling off once sitting in chair for proper positioning. Mobility (including Balance) Bed Mobility Bed Mobility: Yes Rolling Left: 4: Min assist Rolling Left Details (indicate cue type and reason): Min/guard for neck support Left Sidelying to Sit: 4: Min assist;HOB elevated (comment degrees);With rails (30*) Left Sidelying to Sit Details (indicate cue type and  reason):   Pt requires increased assist due to increased anxiety with movement. Husband assists pts neck. cues for LE management.   Sit to Sidelying Left: 4: Min assist;3: Mod assist;HOB elevated (comment degrees) (30*) Sit to Sidelying Left Details (indicate cue type and reason): Requires assist for trunk into bed with husband supporting neck. Cues for LE management. discussed use of wedge at home to simulate elevating HOB here Transfers Sit to Stand: 4: Min assist;With upper extremity assist;From bed Sit to Stand Details (indicate cue type and reason): Min/guard for safety/steadying. Cues for LUE hand placement on bed. Stand to Sit: 4: Min assist;With upper extremity assist;To bed Stand to Sit Details: min/guard for safety and blance with wt shift Ambulation/Gait Ambulation/Gait Assistance: 5: Supervision;4: Min assist Ambulation/Gait Assistance Details (indicate cue type and reason): partial distance with SPC, pt felt she did not need it; pt able to amb smooth level surface with close supervision to min/guard without AD Ambulation Distance (Feet): 280 Feet (total) Assistive device: Straight cane Gait Pattern: Step-through pattern (Left hip in external rotation) Gait velocity: decreased (improving) Stairs: Yes Stairs Assistance: 4: Min assist Stairs Assistance Details (indicate cue type and reason): up stairs with left rail and min assist for balance, cues for foot placement secondary to pt unable to downward gaze; HHA , min assistwhen down stairs and cues same Number of Stairs: 4     Exercise    End of Session PT - End of Session Equipment Utilized During Treatment: Gait belt;Cervical collar Activity Tolerance: Patient tolerated treatment well Patient left: in bed;with call bell in reach;with family/visitor present Nurse Communication:  (pt requested to return to bed, sat in chair earlier) General Behavior During Session: [facility] for tasks performed Cognition: [facility] for tasks  performed  [facility] 08/22/2011, 12:56 PM

## 2011-08-22 NOTE — Progress Notes (Signed)
Initial visit with pt on referral by Chaplain.    Provided support and introduced spiritual care as resource.  Pt well-supported by family.  Looking forward to discharge tomorrow, disappointed that she will have to wear collar and cast for several weeks while her grandchildren are visiting.  Pt was two weeks from retirement when fall occurred.  Missing last two weeks of work on sick leave.   Will continue to follow during admission.  Please page as needs arise.     08/22/11 1800  Clinical Encounter Type  Visited With Patient and family together  Visit Type Initial;Spiritual support;Psychological support;Social support  Referral From Chaplain  Consult/Referral To Chaplain  Stress Factors  Patient Stress Factors Major life changes

## 2011-08-23 MED ORDER — METHOCARBAMOL 500 MG PO TABS: 500.0000 mg | ORAL_TABLET | Freq: Four times a day (QID) | ORAL | Status: AC | PRN

## 2011-08-23 MED ORDER — OXYCODONE HCL 5 MG PO TABS: 5.0000 mg | ORAL_TABLET | ORAL | Status: AC | PRN

## 2011-08-23 NOTE — Discharge Summary (Signed)
.. Physician Discharge Summary  Patient ID: Julie Russell MRN: [id number] DOB/AGE: [birth year] 61 y.o.  Admit date: 08/19/2011 Discharge date: 08/23/2011  Admission Diagnoses:   Fracture and dislocation right elbow and wrist  Status post open reduction internal fixation right distal radius fracture  Status post open reduction internal fixation right lateral condyle fracture highly comminuted in nature.  Closed treatment type II odontoid fracture treated by Dr. Danielle Dess.  Status post fall  Discharge Diagnoses: Same, anterior inner osseous nerve palsy improving    Discharged Condition: Improved  Hospital Course: The patient was admitted February 2 for the above mentioned injuries after a fall she sustained at a friend's house. He was noted to have a significantly comminuted lateral condyle fracture as well as comminuted intra-articular distal radius for a fracture requiring operative intervention. The patient was also noted to have sustained a type II odontoid cervical fracture. She was seen and evaluated by Dr. else in the felt conservative measures and motor. She was cleared for surgical intervention. He underwent the above-mentioned procedures without difficulty, and great care was taken to insure that her cervical collar and splint C-spine was not manipulated throughout the process. Please see operative report for full details. The patient was admitted to the orthopedic unit where she was based on a regime of IV and parenteral pain medications as well as postoperative antibiotics. Therapy was concept for ambulation and activities of daily living as well as edema control. Patient's hospital course was uneventful her pain was eventually controlled by parenteral medications the patient was tolerating a normal diet and voiding without difficulties. She was noted to pass flatus without difficulties. She remained stable postoperatively. She had an initial episode shortly after her surgery she  developed a headache and associated dizziness,thus CT scan of the head was ordered which was negative for any acute intracranial events. An uneventful postoperative course, she tolerated therapy well and maintained adequate pain control with parenteral medications. The decision was made to discharge her home to the care of her family . Followed up with our office in approximately 8-10 days, the patient will call [phone] for any questions or concerns. We have contacted Dr. Verlee Rossetti office today, and they will contact the patient in regards to follow up of her cervical spine .All  encouraged and answered.  Consults: Barnett Abu M.D.  Significant Diagnostic Studies: See hospital course    Discharge Exam: Blood pressure 116/76, pulse 79, temperature 99 F (37.2 C), temperature source Oral, resp. rate 18, height 5\' 2"  (1.575 m), weight 67.132 kg (148 lb), SpO2 94.00%. The patient was awake alert and oriented x3 Aspen collar is in place extraocular motions are intact atraumatic otherwise.   Chest: Chest is clear to auscultation bilaterally no wheezing or rhonchi are noted.  Abdomen bowel sounds are hypoactive abdomen is nontender  Extremity: Her sensation is improving about the digits she has minimal to no edema present anterior interosseous nerve palsy is markedly improved flexion and extension of the digits are improved no signs of infection are present. Clean dry and intact. Lower Extremities are stable to examination    Disposition: Home To care of her family   Medication List  As of 08/23/2011  1:46 PM   TAKE these medications         calcium carbonate 1250 MG chewable tablet   Commonly known as: OS-CAL   Chew 1 tablet by mouth daily.      methocarbamol 500 MG tablet   Commonly known as: ROBAXIN   Take  1 tablet (500 mg total) by mouth every 6 (six) hours as needed (as needed for spasm).      mulitivitamin with minerals Tabs   Take 1 tablet by mouth daily.      oxyCODONE 5 MG  immediate release tablet   Commonly known as: Oxy IR/ROXICODONE   Take 1 tablet (5 mg total) by mouth every 4 (four) hours as needed (take 1 to 2 pills every 4 hours as needed for pain).           Follow-up Information    Follow up with Karen Chafe, MD in 11 days.   Contact information:   [address] [place] [address] [phone]          Signed: Sheran Lawless 08/23/2011, 1:46 PM

## 2011-08-23 NOTE — Progress Notes (Signed)
Physical Therapy Treatment Patient Details Name: Julie Russell MRN: [id number] DOB: October 21, 1950 Today's Date: 08/23/2011  PT Assessment/Plan  PT - Assessment/Plan Comments on Treatment Session: pt and husband feeling more ready for D/C; pt fatigued from activity this am PT Plan: Discharge plan remains appropriate PT Frequency: Min 5X/week Follow Up Recommendations: Home health PT Equipment Recommended: None recommended by PT PT Goals  Acute Rehab PT Goals Pt will go Supine/Side to Sit: with supervision PT Goal: Supine/Side to Sit - Progress: Partly met Pt will go Sit to Supine/Side: with supervision PT Goal: Sit to Supine/Side - Progress: Partly met Pt will go Sit to Stand: with modified independence PT Goal: Sit to Stand - Progress: Partly met Pt will go Stand to Sit: with modified independence PT Goal: Stand to Sit - Progress: Partly met Pt will Ambulate: 51 - 150 feet;with modified independence;with least restrictive assistive device PT Goal: Ambulate - Progress: Partly met Pt will Go Up / Down Stairs: Flight;with modified independence;with least restrictive assistive device PT Goal: Up/Down Stairs - Progress: Not met  PT Treatment Precautions/Restrictions  Precautions Precautions: Fall Required Braces or Orthoses: Yes Cervical Brace: Hard collar (pads adjusted on collar with pt in supine to increase comfor) Restrictions Weight Bearing Restrictions: Yes RUE Weight Bearing: Non weight bearing Other Position/Activity Restrictions: regular sling for OOB but ok to have sling off once sitting in chair for proper positioning. Mobility (including Balance) Bed Mobility Rolling Left: 4: Min assist;5: Supervision;With rail Rolling Left Details (indicate cue type and reason): to support neck Left Sidelying to Sit: 4: Min assist;With rails;HOB flat Left Sidelying to Sit Details (indicate cue type and reason): to support neck Sit to Sidelying Left: 4: Min assist;HOB flat Sit to  Sidelying Left Details (indicate cue type and reason): to support neck. Transfers Sit to Stand: 5: Supervision;From bed;With upper extremity assist Sit to Stand Details (indicate cue type and reason): min guard assist Stand to Sit: 5: Supervision;With upper extremity assist;To bed Stand to Sit Details: min guard assist Ambulation/Gait Ambulation/Gait Assistance: 5: Supervision;4: Min assist Ambulation Distance (Feet): 10 Feet (25) Gait Pattern: Step-through pattern Stairs: Yes Stairs Assistance: 4: Min assist Stairs Assistance Details (indicate cue type and reason): cues for foot placemetn due to decreased periheral vision Stair Management Technique: One rail Left;No rails Number of Stairs: 3     Exercise    End of Session PT - End of Session Equipment Utilized During Treatment: Gait belt;Cervical collar Activity Tolerance: Patient tolerated treatment well Patient left: in bed;with call bell in reach;with family/visitor present General Behavior During Session: [facility] for tasks performed Cognition: [facility] for tasks performed  [facility] 08/23/2011, 2:23 PM

## 2011-08-23 NOTE — Progress Notes (Signed)
Occupational Therapy Treatment Patient Details Name: Julie Russell MRN: [id number] DOB: May 09, 1951 Today's Date: 08/23/2011  OT Assessment/Plan OT Assessment/Plan Comments on Treatment Session: Pt making good progress with OT for ADL tasks. Husband is very hands on and practicing all tasks that OT instructs them on. Pt will benefit from [facility] to follow up to continue to increase her independence.  OT Plan: Discharge plan remains appropriate OT Frequency: Min 2X/week Follow Up Recommendations: Home health OT Equipment Recommended: 3 in 1 bedside comode OT Goals ADL Goals ADL Goal: Lower Body Bathing - Progress: Progressing toward goals ADL Goal: Lower Body Dressing - Progress: Progressing toward goals ADL Goal: Toilet Transfer - Progress: Progressing toward goals ADL Goal: Toileting - Clothing Manipulation - Progress: Progressing toward goals ADL Goal: Additional Goal #1 - Progress: Progressing toward goals  OT Treatment Precautions/Restrictions  Precautions Precautions: Fall Required Braces or Orthoses: Yes Cervical Brace: Hard collar Restrictions RUE Weight Bearing: Non weight bearing Other Position/Activity Restrictions: regular sling for OOB but ok to have sling off once sitting in chair for proper positioning.   ADL ADL Lower Body Bathing: Performed;Minimal assistance Lower Body Bathing Details (indicate cue type and reason): min assist to support R UE while patient standing to wash periareas Where Assessed - Lower Body Bathing: Sit to stand from chair;Other (comment) (from 3in1) Lower Body Dressing: Minimal assistance;Performed Lower Body Dressing Details (indicate cue type and reason): min assist to help pull up pants on right side as they rolled in the back. Where Assessed - Lower Body Dressing: Sit to stand from chair;Other (comment) (from 3in1) Toilet Transfer: Performed;Minimal assistance Toilet Transfer Method: Proofreader: Raised toilet  seat with arms (or 3-in-1 over toilet) Toileting - Clothing Manipulation: Performed;Minimal assistance Where Assessed - Toileting Clothing Manipulation: Standing ADL Comments: Pt's husband practiced donning and doffing sling with supervision first attempt and then later was able to don independently. Pt and husband educated on technique for UB dressing and husband had a button down shirt of his brought in. Was able to fit it over the splint but snug. Pt 's husband verbalized understanding of technique to don shirt as he observed OT don shirt this visit.  Mobility  Bed Mobility Rolling Left: 4: Min assist Rolling Left Details (indicate cue type and reason): to support neck Left Sidelying to Sit: 4: Min assist;With rails;HOB flat Left Sidelying to Sit Details (indicate cue type and reason): to support neck Sit to Sidelying Left: 4: Min assist;HOB flat Sit to Sidelying Left Details (indicate cue type and reason): to support neck. Transfers Sit to Stand: 4: Min assist;From chair/3-in-1;From bed;With upper extremity assist Sit to Stand Details (indicate cue type and reason): min guard assist Stand to Sit: 4: Min assist;To bed;To chair/3-in-1 Stand to Sit Details: min guard assist Exercises    End of Session OT - End of Session Activity Tolerance: Patient tolerated treatment well Patient left: in bed;Other (comment) (with PT) General Behavior During Session: [facility] for tasks performed Cognition: [facility] for tasks performed  Lennox Laity  [phone] 08/23/2011, 11:53 AM

## 2011-08-23 NOTE — Progress Notes (Signed)
  CARE MANAGEMENT NOTE 08/23/2011  Patient:  Julie Russell, Julie Russell   Account Number:  [id number]  Date Initiated:  08/23/2011  Documentation initiated by:  Colleen Can  Subjective/Objective Assessment:   DX FRACTURE/DISLOCATION RT ELBOR, RT WRIST, FX BASE OF ODONTOID PROCESS AT C2 ;Orif rt wrist, Orif distal radius, Orif radial head     Action/Plan:   CM spoke with patient. Plans are for patient to return to her home where spouse will be caregiver. She will need 3N1. C collar in place. Orders for [facility] service and DME   Anticipated DC Date:  08/23/2011   Anticipated DC Plan:  HOME W HOME HEALTH SERVICES  In-house referral  NA      DC Planning Services  CM consult      [facility] Choice  HOME HEALTH   Choice offered to / List presented to:  C-1 Patient   DME arranged  3-N-1      DME agency  [facility]     HH arranged  HH-2 PT  HH-3 OT      [facility] agency  [facility]   Status of service:  In process, will continue to follow Medicare Important Message given?  NO (If response is "NO", the following Medicare IM given date fields will be blank) Date Medicare IM given:   Date Additional Medicare IM given:    Discharge Disposition:    Per UR Regulation:    Comments:  08/23/2011 Colleen Can, RN BSN CCM [phone] Pt offered choice of [facility] agencies. Pt wants [facility] for [facility] services. List of agencies placed on chart. DME to be delivered to patient's room. [facility] will be able to provide [facility] services upon discharge.

## 2011-09-05 ENCOUNTER — Encounter (HOSPITAL_COMMUNITY): Payer: Self-pay | Admitting: Orthopedic Surgery

## 2011-12-01 ENCOUNTER — Other Ambulatory Visit: Payer: Self-pay | Admitting: Neurological Surgery

## 2011-12-01 DIAGNOSIS — M4312 Spondylolisthesis, cervical region: Secondary | ICD-10-CM

## 2011-12-09 ENCOUNTER — Ambulatory Visit
Admission: RE | Admit: 2011-12-09 | Discharge: 2011-12-09 | Disposition: A | Discharge status: Home / Self Care | Payer: BC Managed Care – PPO | Source: Ambulatory Visit | Attending: Neurological Surgery | Admitting: Neurological Surgery

## 2011-12-09 DIAGNOSIS — M4312 Spondylolisthesis, cervical region: Secondary | ICD-10-CM

## 2013-01-31 IMAGING — CT CT CERVICAL SPINE W/O CM
2 series · 10 of 14 positions shown, 12 images · non-contrast
Comparison: None.

CLINICAL DATA: Posterior neck pain and right arm pain after falling
down stairs.

CT CERVICAL SPINE WITHOUT CONTRAST
TECHNIQUE: Multidetector CT imaging of the cervical spine was
performed. Multiplanar CT image reconstructions were also
generated.

[Series 3: c-spine st · axial · 0.25mm/px · z∈[-303,-167]mm · 5 of 104 slices shown, 7 images]
[im 18/104  soft-tissue]
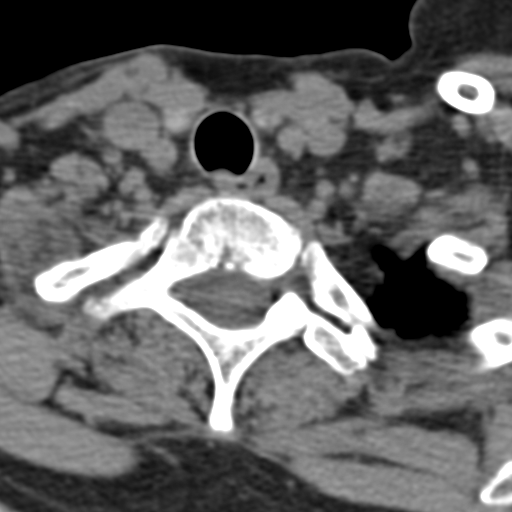
[im 18/104  bone]
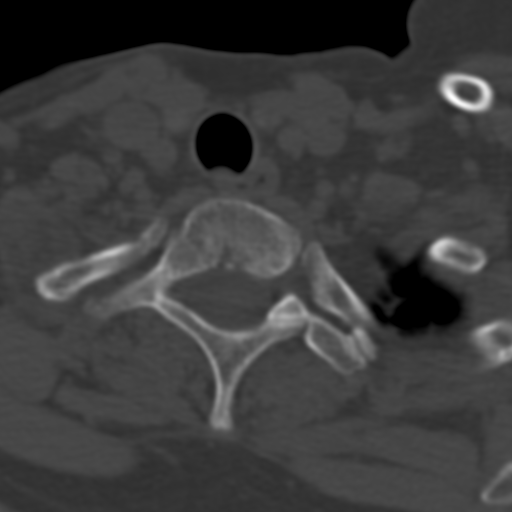
[im 35/104  bone]
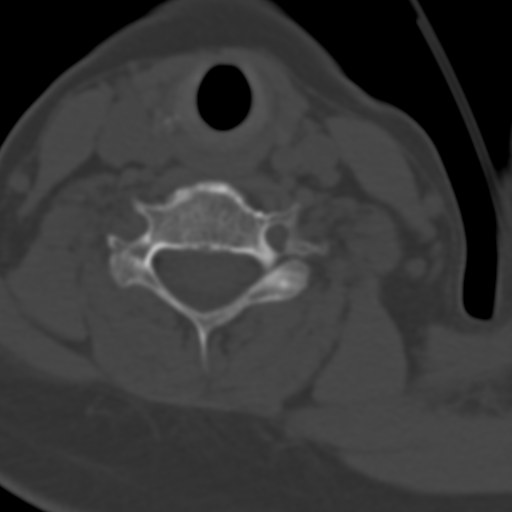
[im 52/104  bone]
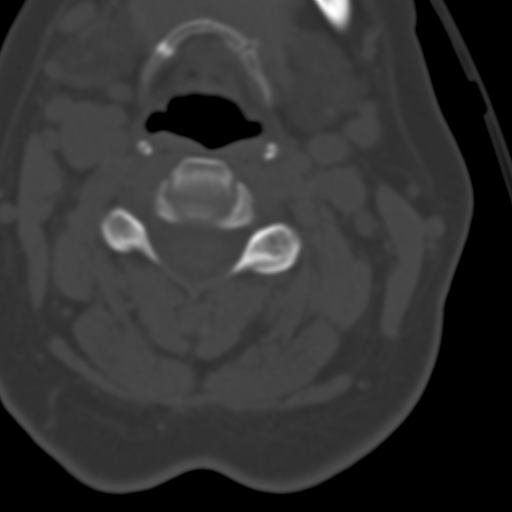
[im 69/104  bone]
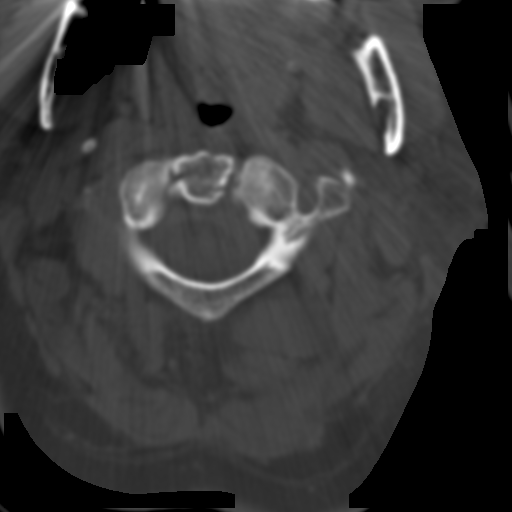
[im 86/104  soft-tissue]
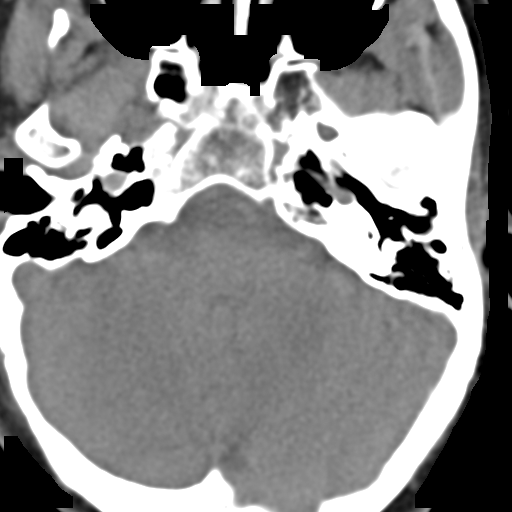
[im 86/104  bone]
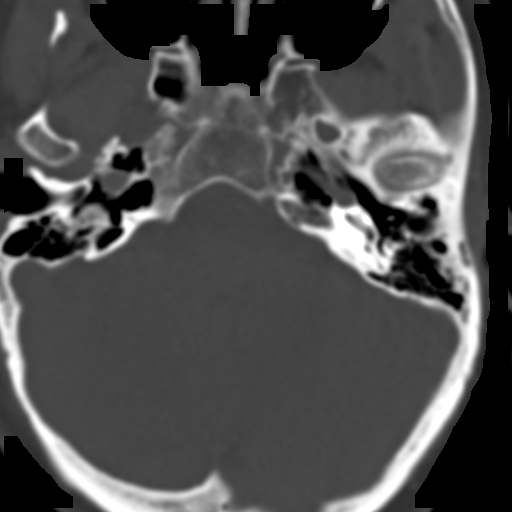

[Series 6: axial reformats · axial · 0.23mm/px · z∈[-315,-180]mm · 5 of 103 slices shown]
[im 18/103  bone]
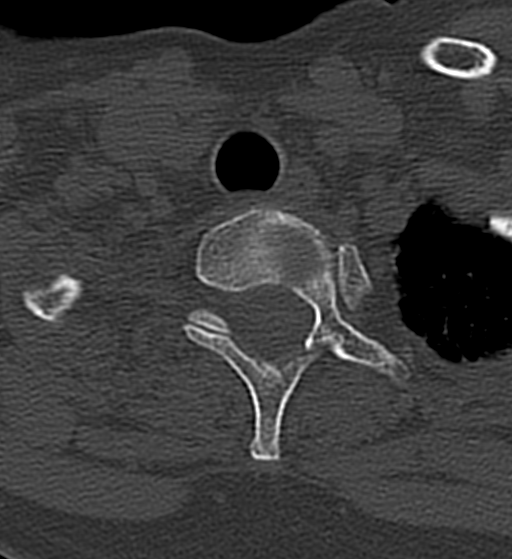
[im 35/103  bone]
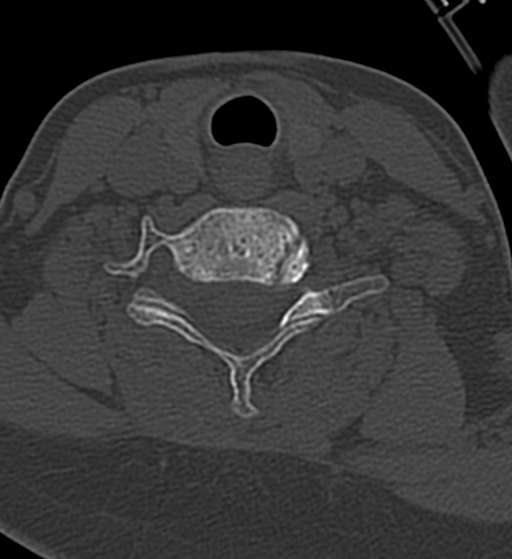
[im 52/103  bone]
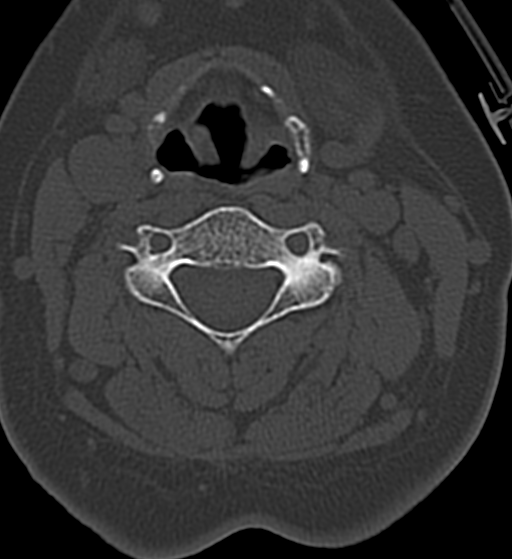
[im 69/103  bone]
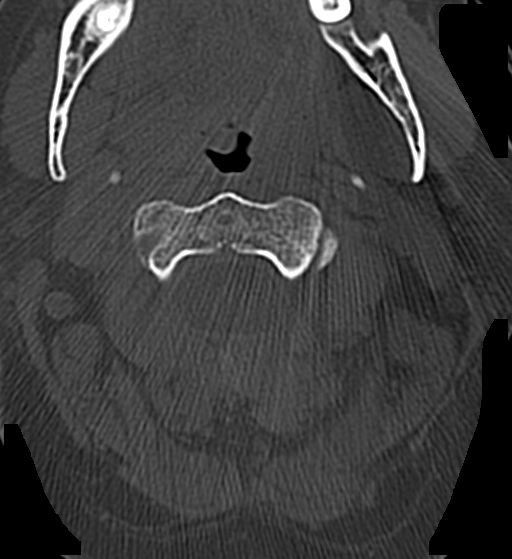
[im 86/103  bone]
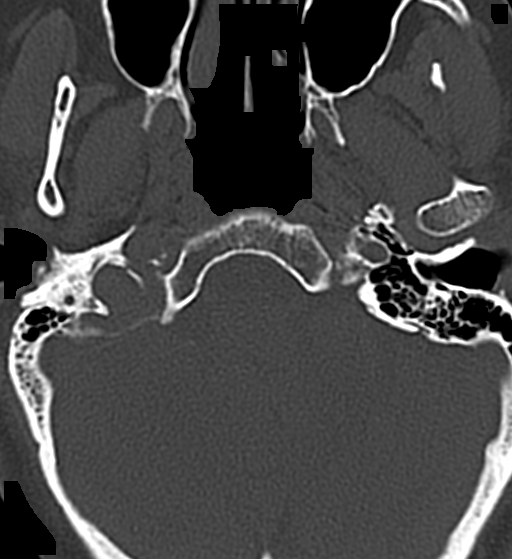

[10 of 14 positions shown; findings below may reference images not displayed]

FINDINGS: There is a fracture through the base of the odontoid
process with 1 mm posterior displacement of the odontoid and C1
with respect to the body of C2.  Minimal prevertebral soft tissue
swelling.  No compromise of the spinal canal.  No compression of
the spinal cord.

No other acute abnormality of the cervical spine.  Degenerative
disc disease at C5-6 and C6-7.
IMPRESSION: Acute fracture through the base of the odontoid with 1 mm posterior
displacement.

Critical Value/emergent results were called by telephone at the
time of interpretation on 08/19/2011  at [DATE] p.m.  to  Dr. Fu,
who verbally acknowledged these results.

## 2013-02-01 IMAGING — CT CT HEAD W/O CM
1 series · 16 of 30 positions shown, 20 images · non-contrast
Comparison: None

CLINICAL DATA: Fall down stairs.  Headache and nausea.

CT HEAD WITHOUT CONTRAST
TECHNIQUE: Contiguous axial images were obtained from the base of
the skull through the vertex without contrast

[Series 2: headseq 4.8 h45s · axial · 0.41mm/px · z∈[-263,-109]mm · 16 of 36 slices shown, 20 images]
[im 2/36  brain]
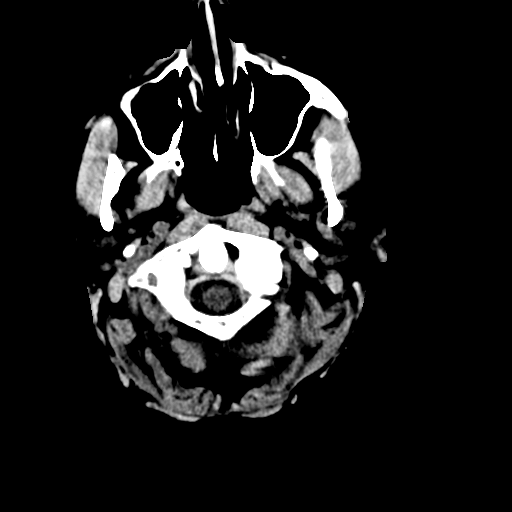
[im 2/36  bone]
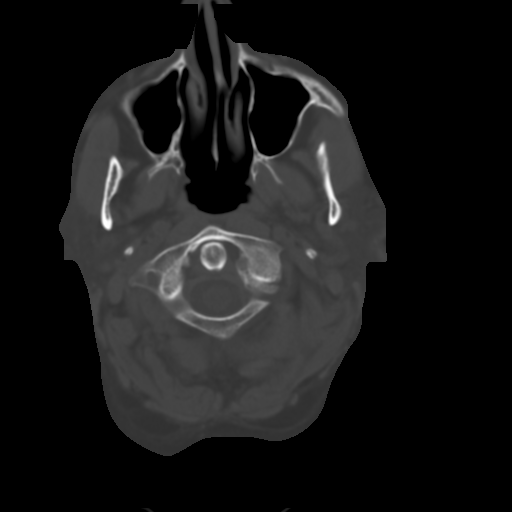
[im 4/36  brain]
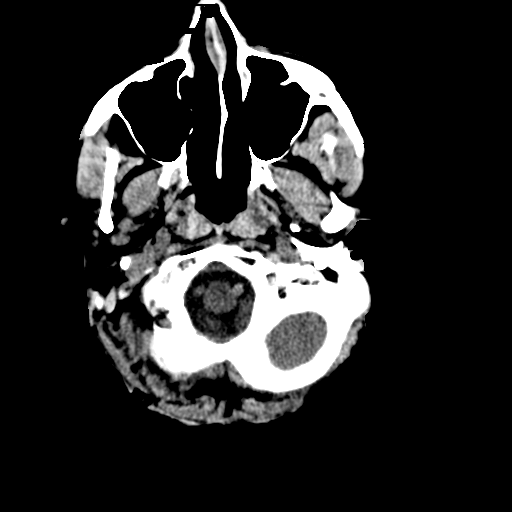
[im 7/36  brain]
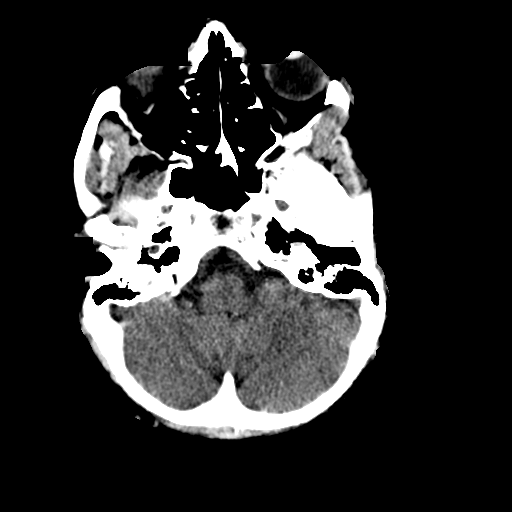
[im 9/36  brain]
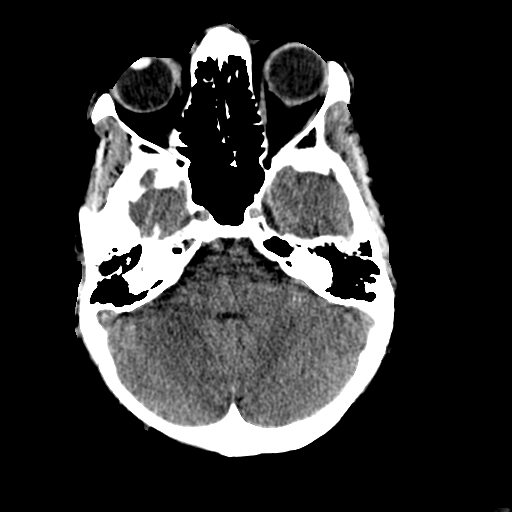
[im 10/36  brain]
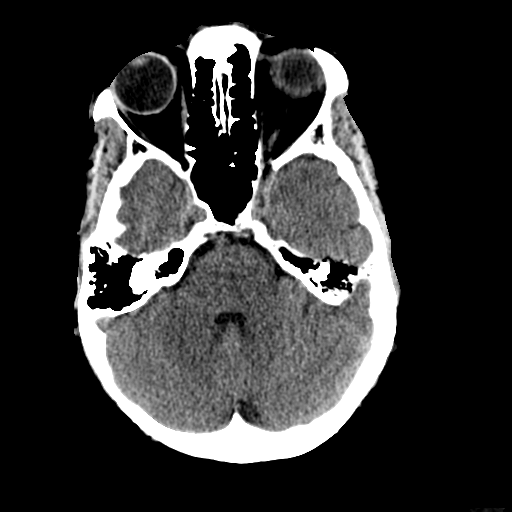
[im 10/36  bone]
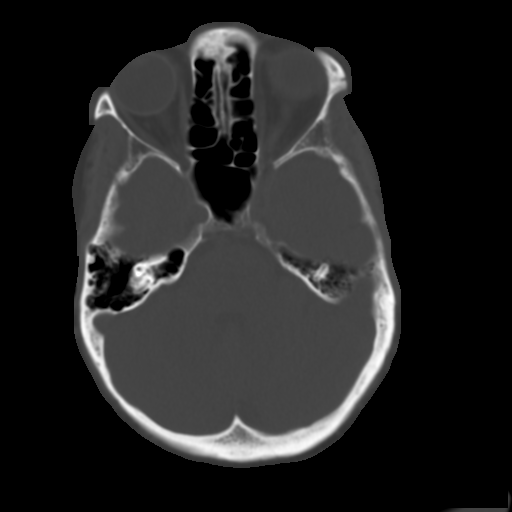
[im 13/36  brain]
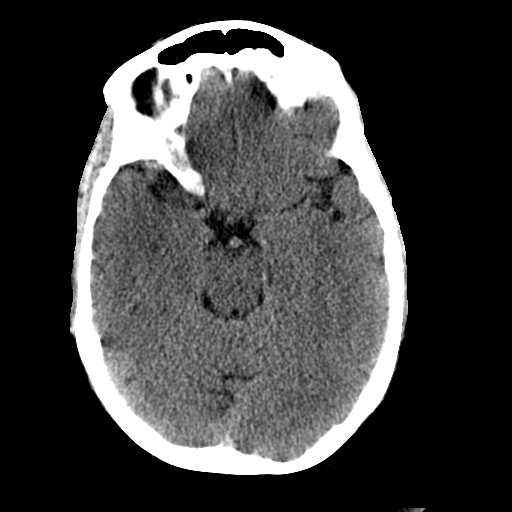
[im 15/36  brain]
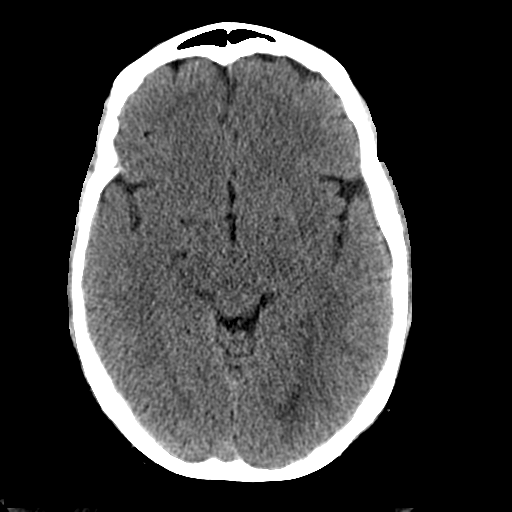
[im 17/36  brain]
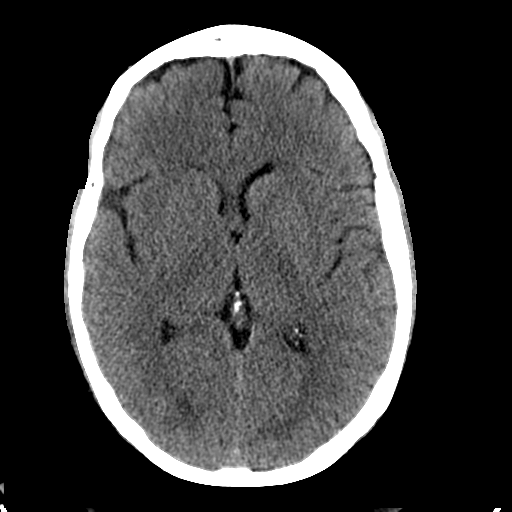
[im 19/36  brain]
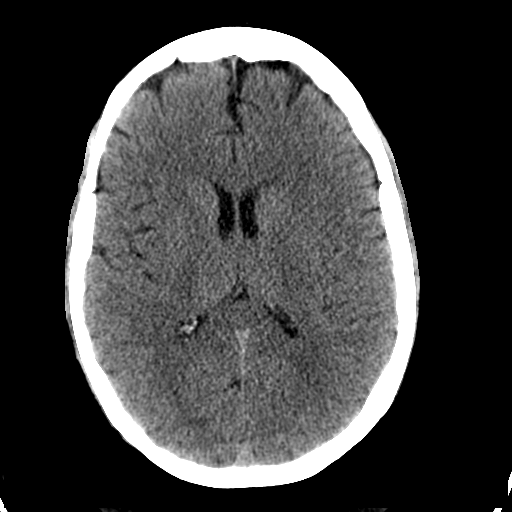
[im 19/36  bone]
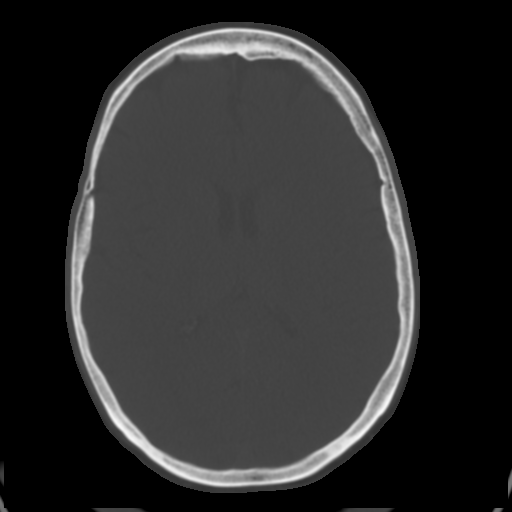
[im 21/36  brain]
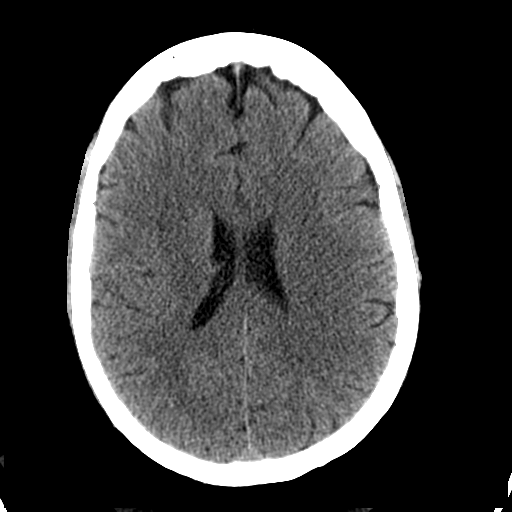
[im 23/36  brain]
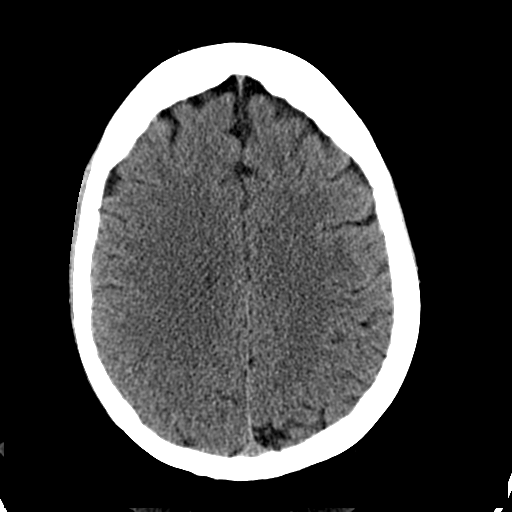
[im 26/36  brain]
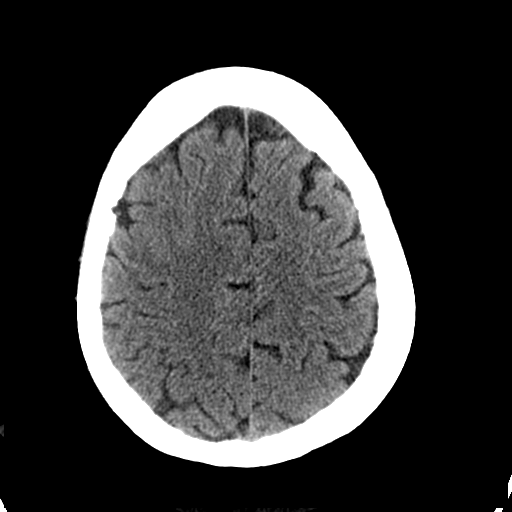
[im 27/36  brain]
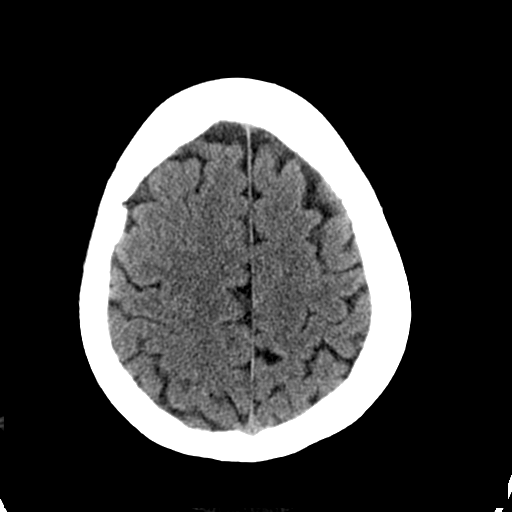
[im 27/36  bone]
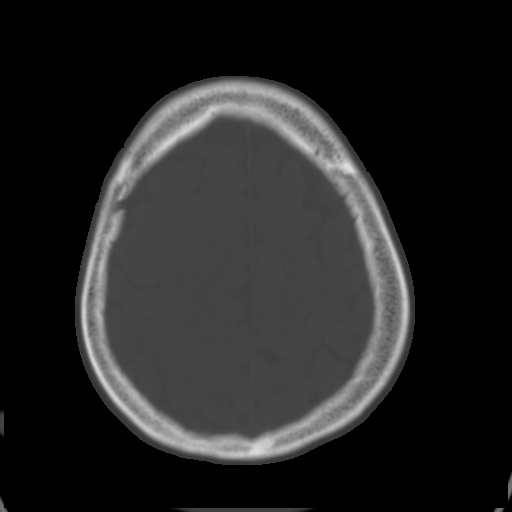
[im 29/36  brain]
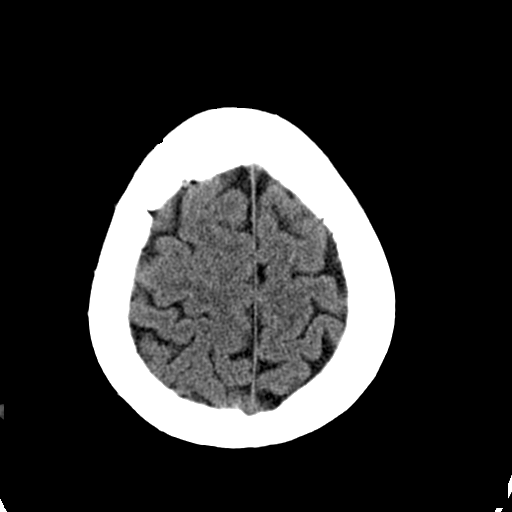
[im 32/36  brain]
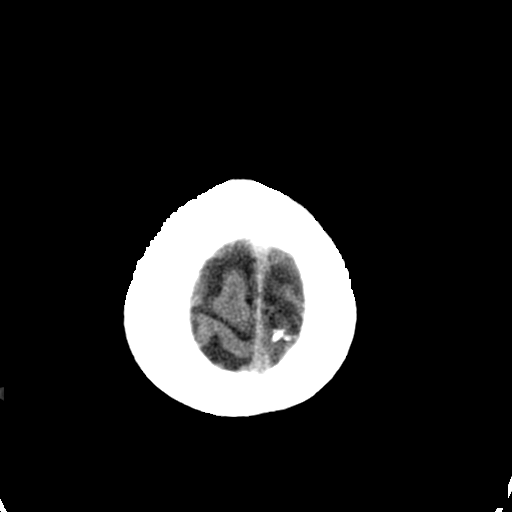
[im 34/36  brain]
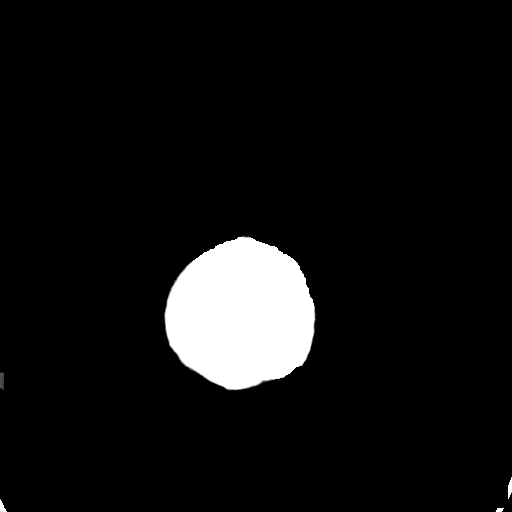

[16 of 30 positions shown; findings below may reference images not displayed]

FINDINGS: There is no evidence of intracranial hemorrhage, brain
edema, or other signs of acute infarction.  There is no evidence of
intracranial mass lesion or mass effect.  No abnormal extraaxial
fluid collections are identified.  There is no evidence of
hydrocephalus, or other significant intracranial abnormality.  No
skull abnormality identified.
IMPRESSION: Negative non-contrast head CT.

## 2016-08-22 DIAGNOSIS — Z1231 Encounter for screening mammogram for malignant neoplasm of breast: Secondary | ICD-10-CM | POA: Diagnosis not present

## 2016-09-08 DIAGNOSIS — E559 Vitamin D deficiency, unspecified: Secondary | ICD-10-CM | POA: Diagnosis not present

## 2016-09-08 DIAGNOSIS — M810 Age-related osteoporosis without current pathological fracture: Secondary | ICD-10-CM | POA: Diagnosis not present

## 2016-09-08 DIAGNOSIS — Z13220 Encounter for screening for lipoid disorders: Secondary | ICD-10-CM | POA: Diagnosis not present

## 2016-09-08 DIAGNOSIS — Z0000 Encounter for general adult medical examination without abnormal findings: Secondary | ICD-10-CM | POA: Diagnosis not present

## 2016-09-08 DIAGNOSIS — R5382 Chronic fatigue, unspecified: Secondary | ICD-10-CM | POA: Diagnosis not present

## 2016-09-12 DIAGNOSIS — R5382 Chronic fatigue, unspecified: Secondary | ICD-10-CM | POA: Diagnosis not present

## 2016-10-17 DIAGNOSIS — Z961 Presence of intraocular lens: Secondary | ICD-10-CM | POA: Diagnosis not present

## 2016-11-30 DIAGNOSIS — M25562 Pain in left knee: Secondary | ICD-10-CM | POA: Diagnosis not present

## 2016-11-30 DIAGNOSIS — S8002XA Contusion of left knee, initial encounter: Secondary | ICD-10-CM | POA: Diagnosis not present

## 2017-03-08 DIAGNOSIS — R195 Other fecal abnormalities: Secondary | ICD-10-CM | POA: Diagnosis not present

## 2017-03-08 DIAGNOSIS — R350 Frequency of micturition: Secondary | ICD-10-CM | POA: Diagnosis not present

## 2017-03-08 DIAGNOSIS — Z8619 Personal history of other infectious and parasitic diseases: Secondary | ICD-10-CM | POA: Diagnosis not present

## 2017-03-08 DIAGNOSIS — R197 Diarrhea, unspecified: Secondary | ICD-10-CM | POA: Diagnosis not present

## 2017-03-28 DIAGNOSIS — Z23 Encounter for immunization: Secondary | ICD-10-CM | POA: Diagnosis not present

## 2017-08-27 DIAGNOSIS — M859 Disorder of bone density and structure, unspecified: Secondary | ICD-10-CM | POA: Diagnosis not present

## 2017-08-27 DIAGNOSIS — M810 Age-related osteoporosis without current pathological fracture: Secondary | ICD-10-CM | POA: Diagnosis not present

## 2017-08-27 DIAGNOSIS — Z1231 Encounter for screening mammogram for malignant neoplasm of breast: Secondary | ICD-10-CM | POA: Diagnosis not present

## 2017-08-27 DIAGNOSIS — Z13820 Encounter for screening for osteoporosis: Secondary | ICD-10-CM | POA: Diagnosis not present

## 2017-08-27 DIAGNOSIS — M818 Other osteoporosis without current pathological fracture: Secondary | ICD-10-CM | POA: Diagnosis not present

## 2017-08-27 DIAGNOSIS — Z780 Asymptomatic menopausal state: Secondary | ICD-10-CM | POA: Diagnosis not present

## 2017-10-29 DIAGNOSIS — L601 Onycholysis: Secondary | ICD-10-CM | POA: Diagnosis not present

## 2017-10-29 DIAGNOSIS — R5382 Chronic fatigue, unspecified: Secondary | ICD-10-CM | POA: Diagnosis not present

## 2017-10-29 DIAGNOSIS — R001 Bradycardia, unspecified: Secondary | ICD-10-CM | POA: Diagnosis not present

## 2017-10-29 DIAGNOSIS — Z23 Encounter for immunization: Secondary | ICD-10-CM | POA: Diagnosis not present

## 2017-10-29 DIAGNOSIS — Z136 Encounter for screening for cardiovascular disorders: Secondary | ICD-10-CM | POA: Diagnosis not present

## 2017-10-29 DIAGNOSIS — Z1211 Encounter for screening for malignant neoplasm of colon: Secondary | ICD-10-CM | POA: Diagnosis not present

## 2017-10-29 DIAGNOSIS — M810 Age-related osteoporosis without current pathological fracture: Secondary | ICD-10-CM | POA: Diagnosis not present

## 2017-10-29 DIAGNOSIS — Z0000 Encounter for general adult medical examination without abnormal findings: Secondary | ICD-10-CM | POA: Diagnosis not present

## 2017-11-26 DIAGNOSIS — Z961 Presence of intraocular lens: Secondary | ICD-10-CM | POA: Diagnosis not present

## 2017-11-26 DIAGNOSIS — H5212 Myopia, left eye: Secondary | ICD-10-CM | POA: Diagnosis not present

## 2018-01-01 DIAGNOSIS — M810 Age-related osteoporosis without current pathological fracture: Secondary | ICD-10-CM | POA: Diagnosis not present

## 2018-04-03 DIAGNOSIS — Z23 Encounter for immunization: Secondary | ICD-10-CM | POA: Diagnosis not present

## 2018-05-23 DIAGNOSIS — H33011 Retinal detachment with single break, right eye: Secondary | ICD-10-CM | POA: Diagnosis not present

## 2018-05-23 DIAGNOSIS — H33051 Total retinal detachment, right eye: Secondary | ICD-10-CM | POA: Diagnosis not present

## 2018-05-23 DIAGNOSIS — H2512 Age-related nuclear cataract, left eye: Secondary | ICD-10-CM | POA: Diagnosis not present

## 2018-05-23 DIAGNOSIS — Z961 Presence of intraocular lens: Secondary | ICD-10-CM | POA: Diagnosis not present

## 2018-05-27 DIAGNOSIS — Z09 Encounter for follow-up examination after completed treatment for conditions other than malignant neoplasm: Secondary | ICD-10-CM | POA: Diagnosis not present

## 2018-05-27 DIAGNOSIS — H33011 Retinal detachment with single break, right eye: Secondary | ICD-10-CM | POA: Diagnosis not present

## 2018-06-24 DIAGNOSIS — H33011 Retinal detachment with single break, right eye: Secondary | ICD-10-CM | POA: Diagnosis not present

## 2018-06-24 DIAGNOSIS — Z09 Encounter for follow-up examination after completed treatment for conditions other than malignant neoplasm: Secondary | ICD-10-CM | POA: Diagnosis not present

## 2018-07-03 DIAGNOSIS — M810 Age-related osteoporosis without current pathological fracture: Secondary | ICD-10-CM | POA: Diagnosis not present

## 2018-09-30 DIAGNOSIS — H33011 Retinal detachment with single break, right eye: Secondary | ICD-10-CM | POA: Diagnosis not present

## 2018-09-30 DIAGNOSIS — H26491 Other secondary cataract, right eye: Secondary | ICD-10-CM | POA: Diagnosis not present

## 2018-09-30 DIAGNOSIS — H2512 Age-related nuclear cataract, left eye: Secondary | ICD-10-CM | POA: Diagnosis not present

## 2018-09-30 DIAGNOSIS — Z961 Presence of intraocular lens: Secondary | ICD-10-CM | POA: Diagnosis not present

## 2018-11-28 DIAGNOSIS — H2512 Age-related nuclear cataract, left eye: Secondary | ICD-10-CM | POA: Diagnosis not present

## 2018-11-28 DIAGNOSIS — H5212 Myopia, left eye: Secondary | ICD-10-CM | POA: Diagnosis not present

## 2018-11-28 DIAGNOSIS — Z961 Presence of intraocular lens: Secondary | ICD-10-CM | POA: Diagnosis not present

## 2019-01-07 ENCOUNTER — Ambulatory Visit (INDEPENDENT_AMBULATORY_CARE_PROVIDER_SITE_OTHER): Payer: Medicare Other

## 2019-01-07 ENCOUNTER — Other Ambulatory Visit: Payer: Self-pay

## 2019-01-07 ENCOUNTER — Ambulatory Visit (HOSPITAL_COMMUNITY)
Admission: EM | Admit: 2019-01-07 | Discharge: 2019-01-07 | Disposition: A | Discharge status: Home / Self Care | Payer: Medicare Other | Attending: Family Medicine | Admitting: Family Medicine

## 2019-01-07 ENCOUNTER — Encounter (HOSPITAL_COMMUNITY): Payer: Self-pay

## 2019-01-07 DIAGNOSIS — S82035A Nondisplaced transverse fracture of left patella, initial encounter for closed fracture: Secondary | ICD-10-CM

## 2019-01-07 DIAGNOSIS — S82002A Unspecified fracture of left patella, initial encounter for closed fracture: Secondary | ICD-10-CM | POA: Diagnosis not present

## 2019-01-07 NOTE — Discharge Instructions (Signed)
Ice to reduce swelling Try to limit your weightbearing on this leg Leave the immobilizer on.  Do not bend knee Call [facility] in the morning to set up a follow-up appointment

## 2019-01-07 NOTE — ED Provider Notes (Signed)
[place]    CSN: [id number] Arrival date & time: 01/07/19  1346     History   Chief Complaint Chief Complaint  Patient presents with  . Appointment    1:50  . Knee Pain    HPI Julie Russell is a 67 y.o. female.   HPI  Patient was walking her dog and was going through deep breaths.  Her toe hit a walk edge that was not visible through the grass and she fell forward.  Landed on both knees both hands.  Right knee is skin with a little bruise.  Both hands are little bruised.  Left knee hit the hurts.  Left knee is painful, pain with weightbearing, very swollen, very bruised.  She is here for evaluation of left knee pain. Patient has known osteoporosis.  She is on alendronate.  No broken bones.  She states she does exercise regularly and take calcium to protect her bones.  History reviewed. No pertinent past medical history.  There are no active problems to display for this patient.   Past Surgical History:  Procedure Laterality Date  . ORIF ELBOW FRACTURE  08/19/2011   Procedure: OPEN REDUCTION INTERNAL FIXATION (ORIF) ELBOW/OLECRANON FRACTURE;  Surgeon: Paulene Floor, MD;  Location: [facility];  Service: Orthopedics;  Laterality: Right;    OB History   No obstetric history on file.      Home Medications    Prior to Admission medications   Medication Sig Start Date End Date Taking? Authorizing Provider  alendronate (FOSAMAX) 70 MG tablet TAKE ONE TAB BY MOUTH ONCE WEEKLY WITH A FULL GLASS OF WATER 07/03/18  Yes [provider]  calcium carbonate (OS-CAL) 1250 MG chewable tablet Chew 1 tablet by mouth daily.    [provider]  Multiple Vitamin (MULITIVITAMIN WITH MINERALS) TABS Take 1 tablet by mouth daily.    [provider]    Family History Family History  Problem Relation Age of Onset  . Cancer Mother   . Healthy Father     Social History Social History   Tobacco Use  . Smoking status: Never Smoker  .  Smokeless tobacco: Never Used  Substance Use Topics  . Alcohol use: No  . Drug use: No     Allergies   Doxycycline   Review of Systems Review of Systems  Constitutional: Negative for chills and fever.  HENT: Negative for ear pain and sore throat.   Eyes: Negative for pain and visual disturbance.  Respiratory: Negative for cough and shortness of breath.   Cardiovascular: Negative for chest pain and palpitations.  Gastrointestinal: Negative for abdominal pain and vomiting.  Genitourinary: Negative for dysuria and hematuria.  Musculoskeletal: Positive for arthralgias, gait problem and joint swelling. Negative for back pain.  Skin: Negative for color change and rash.  Neurological: Negative for seizures and syncope.  All other systems reviewed and are negative.    Physical Exam Triage Vital Signs ED Triage Vitals  Enc Vitals Group     BP 01/07/19 1427 111/71     Pulse Rate 01/07/19 1427 79     Resp 01/07/19 1427 17     Temp 01/07/19 1427 98.6 F (37 C)     Temp Source 01/07/19 1427 Oral     SpO2 01/07/19 1427 100 %     Weight --      Height --      Head Circumference --      Peak Flow --  Pain Score 01/07/19 1424 4     Pain Loc --      Pain Edu? --      Excl. in GC? --    No data found.  Updated Vital Signs BP 111/71 (BP Location: Right Arm)   Pulse 79   Temp 98.6 F (37 C) (Oral)   Resp 17   SpO2 100%     Physical Exam Constitutional:      General: She is not in acute distress.    Appearance: She is well-developed and normal weight.  HENT:     Head: Normocephalic and atraumatic.  Eyes:     Conjunctiva/sclera: Conjunctivae normal.     Pupils: Pupils are equal, round, and reactive to light.  Neck:     Musculoskeletal: Normal range of motion.  Cardiovascular:     Rate and Rhythm: Normal rate.  Pulmonary:     Effort: Pulmonary effort is normal. No respiratory distress.  Abdominal:     General: There is no distension.     Palpations: Abdomen is  soft.  Musculoskeletal: Normal range of motion.     Comments: Left knee has large effusion.  Bruising over the inferior patella.  Tenderness to palpation of patella but although it does feel intact.  Mild joint line tenderness.  She lacks the final 5 to 10 degrees of flexion and extension.  No instability to exam.  Skin:    General: Skin is warm and dry.  Neurological:     Mental Status: She is alert.     Gait: Gait abnormal.  Psychiatric:        Mood and Affect: Mood normal.        Behavior: Behavior normal.      UC Treatments / Results  Labs (all labs ordered are listed, but only abnormal results are displayed) Labs Reviewed - No data to display  EKG None  Radiology Dg Knee Ap/lat W/sunrise Left  Result Date: 01/07/2019 CLINICAL DATA:  68 year old female with a history of fall on the knee EXAM: LEFT KNEE 3 VIEWS COMPARISON:  None. FINDINGS: Lateral view demonstrates nondisplaced patellar fracture. Associated large joint effusion. No acute fracture identified of the femur tibia or fibula. Lateral greater than medial joint space narrowing with marginal osteophyte formation. No radiopaque foreign body. IMPRESSION: Nondisplaced fracture of the patella with associated joint effusion. Electronically Signed   By: Gilmer MorJaime  Wagner D.O.   On: 01/07/2019 15:53    Procedures Procedures (including critical care time)  Medications Ordered in UC Medications - No data to display  Initial Impression / Assessment and Plan / UC Course  I have reviewed the triage vital signs and the nursing notes.  Pertinent labs & imaging results that were available during my care of the patient were reviewed by me and considered in my medical decision making (see chart for details).    Patient has a fractured patella.  I showed her this on x-ray.  We discussed that she needs to keep her leg straight.  By flexing the patella she could further separate the 2 fragments.  She does have an orthopedist.  She will  call them tomorrow.  I offered her pain management.  She declines medication.  Final Clinical Impressions(s) / UC Diagnoses   Final diagnoses:  Closed nondisplaced transverse fracture of left patella, initial encounter     Discharge Instructions     Ice to reduce swelling Try to limit your weightbearing on this leg Leave the immobilizer on.  Do not bend  knee Call [facility] in the morning to set up a follow-up appointment    ED Prescriptions    None     Controlled Substance Prescriptions [place] Controlled Substance Registry consulted? Not Applicable   Eustace MooreNelson,  Sue, MD 01/07/19 1859

## 2019-01-07 NOTE — ED Triage Notes (Signed)
Patient presents to Urgent Care with complaints of left knee pain since tripping over the sidewalk last night. Patient reports she fell onto her left knee cap and it is sore there as well as the back of her knee. Pt has ace wrap on injured area upon arrival.

## 2019-01-08 DIAGNOSIS — S82035A Nondisplaced transverse fracture of left patella, initial encounter for closed fracture: Secondary | ICD-10-CM | POA: Diagnosis not present

## 2019-01-08 DIAGNOSIS — M25562 Pain in left knee: Secondary | ICD-10-CM | POA: Diagnosis not present

## 2019-01-22 DIAGNOSIS — M25562 Pain in left knee: Secondary | ICD-10-CM | POA: Diagnosis not present

## 2019-01-31 DIAGNOSIS — R9431 Abnormal electrocardiogram [ECG] [EKG]: Secondary | ICD-10-CM | POA: Diagnosis not present

## 2019-01-31 DIAGNOSIS — E559 Vitamin D deficiency, unspecified: Secondary | ICD-10-CM | POA: Diagnosis not present

## 2019-01-31 DIAGNOSIS — Z1211 Encounter for screening for malignant neoplasm of colon: Secondary | ICD-10-CM | POA: Diagnosis not present

## 2019-01-31 DIAGNOSIS — Z0000 Encounter for general adult medical examination without abnormal findings: Secondary | ICD-10-CM | POA: Diagnosis not present

## 2019-01-31 DIAGNOSIS — E7800 Pure hypercholesterolemia, unspecified: Secondary | ICD-10-CM | POA: Diagnosis not present

## 2019-01-31 DIAGNOSIS — M810 Age-related osteoporosis without current pathological fracture: Secondary | ICD-10-CM | POA: Diagnosis not present

## 2019-02-01 DIAGNOSIS — I499 Cardiac arrhythmia, unspecified: Secondary | ICD-10-CM | POA: Diagnosis not present

## 2019-02-04 DIAGNOSIS — E559 Vitamin D deficiency, unspecified: Secondary | ICD-10-CM | POA: Diagnosis not present

## 2019-02-04 DIAGNOSIS — E7800 Pure hypercholesterolemia, unspecified: Secondary | ICD-10-CM | POA: Diagnosis not present

## 2019-02-04 DIAGNOSIS — Z136 Encounter for screening for cardiovascular disorders: Secondary | ICD-10-CM | POA: Diagnosis not present

## 2019-02-04 DIAGNOSIS — Z0000 Encounter for general adult medical examination without abnormal findings: Secondary | ICD-10-CM | POA: Diagnosis not present

## 2019-02-04 DIAGNOSIS — M810 Age-related osteoporosis without current pathological fracture: Secondary | ICD-10-CM | POA: Diagnosis not present

## 2019-02-19 DIAGNOSIS — M25562 Pain in left knee: Secondary | ICD-10-CM | POA: Diagnosis not present

## 2019-02-28 DIAGNOSIS — R011 Cardiac murmur, unspecified: Secondary | ICD-10-CM | POA: Diagnosis not present

## 2019-02-28 DIAGNOSIS — R5382 Chronic fatigue, unspecified: Secondary | ICD-10-CM | POA: Diagnosis not present

## 2019-02-28 DIAGNOSIS — R0989 Other specified symptoms and signs involving the circulatory and respiratory systems: Secondary | ICD-10-CM | POA: Diagnosis not present

## 2019-02-28 DIAGNOSIS — R9431 Abnormal electrocardiogram [ECG] [EKG]: Secondary | ICD-10-CM | POA: Diagnosis not present

## 2019-02-28 DIAGNOSIS — E7800 Pure hypercholesterolemia, unspecified: Secondary | ICD-10-CM | POA: Diagnosis not present

## 2019-03-04 DIAGNOSIS — Z1231 Encounter for screening mammogram for malignant neoplasm of breast: Secondary | ICD-10-CM | POA: Diagnosis not present

## 2019-03-21 DIAGNOSIS — R9431 Abnormal electrocardiogram [ECG] [EKG]: Secondary | ICD-10-CM | POA: Diagnosis not present

## 2019-03-21 DIAGNOSIS — R5382 Chronic fatigue, unspecified: Secondary | ICD-10-CM | POA: Diagnosis not present

## 2019-04-11 DIAGNOSIS — Z23 Encounter for immunization: Secondary | ICD-10-CM | POA: Diagnosis not present

## 2019-08-12 ENCOUNTER — Ambulatory Visit: Payer: Medicare Other

## 2019-08-23 ENCOUNTER — Ambulatory Visit: Payer: Medicare Other | Attending: Internal Medicine

## 2019-08-23 ENCOUNTER — Ambulatory Visit: Payer: Medicare Other

## 2019-08-23 DIAGNOSIS — Z23 Encounter for immunization: Secondary | ICD-10-CM

## 2019-08-23 NOTE — Progress Notes (Signed)
   Covid-19 Vaccination Clinic  Name:  Julie Russell    MRN: [id number] DOB: 12-15-1950  08/23/2019  [name] was observed post Covid-19 immunization for 15 minutes without incidence. She was provided with Vaccine Information Sheet and instruction to access the V-Safe system.   [name] was instructed to call 911 with any severe reactions post vaccine: Marland Kitchen Difficulty breathing  . Swelling of your face and throat  . A fast heartbeat  . A bad rash all over your body  . Dizziness and weakness    Immunizations Administered    Name Date Dose VIS Date Route   Pfizer COVID-19 Vaccine 08/23/2019  2:20 PM 0.3 mL 06/27/2019 Intramuscular   Manufacturer: [employer], [employer]   Lot: [id number]   NDC: [address]-3

## 2019-09-05 DIAGNOSIS — M79671 Pain in right foot: Secondary | ICD-10-CM | POA: Diagnosis not present

## 2019-09-05 DIAGNOSIS — S9031XA Contusion of right foot, initial encounter: Secondary | ICD-10-CM | POA: Diagnosis not present

## 2019-09-16 ENCOUNTER — Ambulatory Visit: Payer: Medicare Other | Attending: Internal Medicine

## 2019-09-16 DIAGNOSIS — Z23 Encounter for immunization: Secondary | ICD-10-CM | POA: Insufficient documentation

## 2019-09-16 NOTE — Progress Notes (Signed)
   Covid-19 Vaccination Clinic  Name:  Julie Russell    MRN: [id number] DOB: 06-16-1951  09/16/2019  Ms. [name] was observed post Covid-19 immunization for 15 minutes without incident. She was provided with Vaccine Information Sheet and instruction to access the V-Safe system.   Ms. [name] was instructed to call 911 with any severe reactions post vaccine: Marland Kitchen Difficulty breathing  . Swelling of face and throat  . A fast heartbeat  . A bad rash all over body  . Dizziness and weakness   Immunizations Administered    Name Date Dose VIS Date Route   Pfizer COVID-19 Vaccine 09/16/2019 11:29 AM 0.3 mL 06/27/2019 Intramuscular   Manufacturer: [employer], [employer]   Lot: [id number]   NDC: [address]-2

## 2019-09-17 ENCOUNTER — Ambulatory Visit: Payer: Medicare Other

## 2019-09-29 DIAGNOSIS — H33011 Retinal detachment with single break, right eye: Secondary | ICD-10-CM | POA: Diagnosis not present

## 2019-09-29 DIAGNOSIS — H2512 Age-related nuclear cataract, left eye: Secondary | ICD-10-CM | POA: Diagnosis not present

## 2019-09-29 DIAGNOSIS — Z961 Presence of intraocular lens: Secondary | ICD-10-CM | POA: Diagnosis not present

## 2019-09-29 DIAGNOSIS — H43812 Vitreous degeneration, left eye: Secondary | ICD-10-CM | POA: Diagnosis not present

## 2019-10-09 DIAGNOSIS — Z780 Asymptomatic menopausal state: Secondary | ICD-10-CM | POA: Diagnosis not present

## 2019-10-09 DIAGNOSIS — M810 Age-related osteoporosis without current pathological fracture: Secondary | ICD-10-CM | POA: Diagnosis not present

## 2019-12-02 DIAGNOSIS — Z961 Presence of intraocular lens: Secondary | ICD-10-CM | POA: Diagnosis not present

## 2019-12-02 DIAGNOSIS — H524 Presbyopia: Secondary | ICD-10-CM | POA: Diagnosis not present

## 2020-02-02 DIAGNOSIS — T63441A Toxic effect of venom of bees, accidental (unintentional), initial encounter: Secondary | ICD-10-CM | POA: Diagnosis not present

## 2020-02-02 DIAGNOSIS — E663 Overweight: Secondary | ICD-10-CM | POA: Diagnosis not present

## 2020-02-02 DIAGNOSIS — S60561A Insect bite (nonvenomous) of right hand, initial encounter: Secondary | ICD-10-CM | POA: Diagnosis not present

## 2020-02-02 DIAGNOSIS — Z1231 Encounter for screening mammogram for malignant neoplasm of breast: Secondary | ICD-10-CM | POA: Diagnosis not present

## 2020-02-02 DIAGNOSIS — E559 Vitamin D deficiency, unspecified: Secondary | ICD-10-CM | POA: Diagnosis not present

## 2020-02-02 DIAGNOSIS — E7800 Pure hypercholesterolemia, unspecified: Secondary | ICD-10-CM | POA: Diagnosis not present

## 2020-02-02 DIAGNOSIS — Z0000 Encounter for general adult medical examination without abnormal findings: Secondary | ICD-10-CM | POA: Diagnosis not present

## 2020-02-02 DIAGNOSIS — Z1211 Encounter for screening for malignant neoplasm of colon: Secondary | ICD-10-CM | POA: Diagnosis not present

## 2020-02-02 DIAGNOSIS — M810 Age-related osteoporosis without current pathological fracture: Secondary | ICD-10-CM | POA: Diagnosis not present

## 2020-02-02 DIAGNOSIS — R5382 Chronic fatigue, unspecified: Secondary | ICD-10-CM | POA: Diagnosis not present

## 2020-03-05 DIAGNOSIS — Z1231 Encounter for screening mammogram for malignant neoplasm of breast: Secondary | ICD-10-CM | POA: Diagnosis not present

## 2020-04-09 DIAGNOSIS — Z23 Encounter for immunization: Secondary | ICD-10-CM | POA: Diagnosis not present

## 2020-05-15 ENCOUNTER — Ambulatory Visit: Payer: Medicare Other | Attending: Internal Medicine

## 2020-05-15 DIAGNOSIS — Z23 Encounter for immunization: Secondary | ICD-10-CM

## 2020-05-15 NOTE — Progress Notes (Signed)
   Covid-19 Vaccination Clinic  Name:  Julie Russell    MRN: [id number] DOB: 12/27/1950  05/15/2020  [name] was observed post Covid-19 immunization for 15 minutes without incident. She was provided with Vaccine Information Sheet and instruction to access the V-Safe system.   [name] was instructed to call 911 with any severe reactions post vaccine: Marland Kitchen Difficulty breathing  . Swelling of face and throat  . A fast heartbeat  . A bad rash all over body  . Dizziness and weakness

## 2020-05-16 DIAGNOSIS — W57XXXA Bitten or stung by nonvenomous insect and other nonvenomous arthropods, initial encounter: Secondary | ICD-10-CM | POA: Diagnosis not present

## 2020-05-16 DIAGNOSIS — R600 Localized edema: Secondary | ICD-10-CM | POA: Diagnosis not present

## 2020-05-24 DIAGNOSIS — Z20828 Contact with and (suspected) exposure to other viral communicable diseases: Secondary | ICD-10-CM | POA: Diagnosis not present

## 2020-07-06 DIAGNOSIS — M810 Age-related osteoporosis without current pathological fracture: Secondary | ICD-10-CM | POA: Diagnosis not present

## 2020-07-06 DIAGNOSIS — E559 Vitamin D deficiency, unspecified: Secondary | ICD-10-CM | POA: Diagnosis not present

## 2020-09-27 ENCOUNTER — Encounter (INDEPENDENT_AMBULATORY_CARE_PROVIDER_SITE_OTHER): Payer: Medicare Other | Admitting: Ophthalmology

## 2020-10-07 ENCOUNTER — Other Ambulatory Visit: Payer: Self-pay

## 2020-10-07 ENCOUNTER — Encounter (INDEPENDENT_AMBULATORY_CARE_PROVIDER_SITE_OTHER): Payer: Self-pay | Admitting: Ophthalmology

## 2020-10-07 ENCOUNTER — Ambulatory Visit (INDEPENDENT_AMBULATORY_CARE_PROVIDER_SITE_OTHER): Payer: Medicare Other | Admitting: Ophthalmology

## 2020-10-07 DIAGNOSIS — H43812 Vitreous degeneration, left eye: Secondary | ICD-10-CM | POA: Diagnosis not present

## 2020-10-07 DIAGNOSIS — Z961 Presence of intraocular lens: Secondary | ICD-10-CM | POA: Diagnosis not present

## 2020-10-07 DIAGNOSIS — H43391 Other vitreous opacities, right eye: Secondary | ICD-10-CM | POA: Insufficient documentation

## 2020-10-07 DIAGNOSIS — H2512 Age-related nuclear cataract, left eye: Secondary | ICD-10-CM | POA: Diagnosis not present

## 2020-10-07 DIAGNOSIS — H33011 Retinal detachment with single break, right eye: Secondary | ICD-10-CM

## 2020-10-07 NOTE — Assessment & Plan Note (Addendum)
Consequence of posterior vitreous detachment as well as retinal detachment repair OD via pneumatic retinopexy, not visually significant in the right eye.

## 2020-10-07 NOTE — Progress Notes (Signed)
10/07/2020     CHIEF COMPLAINT Patient presents for Retina Follow Up (1 Year f\u OU. FP/Pt states vision is stable. Denies new FOL and floaters.)   HISTORY OF PRESENT ILLNESS: Julie Russell is a 70 y.o. female who presents to the clinic today for:   HPI    Retina Follow Up    Patient presents with  PVD.  In left eye.  Severity is moderate.  Duration of 1 year.  Since onset it is stable.  I, the attending physician,  performed the HPI with the patient and updated documentation appropriately. Additional comments: 1 Year f\u OU. FP Pt states vision is stable. Denies new FOL and floaters.       Last edited by [name] on 10/07/2020  2:26 PM. (History)      Referring physician: No referring provider defined for this encounter.  HISTORICAL INFORMATION:   Selected notes from the MEDICAL RECORD NUMBER       CURRENT MEDICATIONS: No current outpatient medications on file. (Ophthalmic Drugs)   No current facility-administered medications for this visit. (Ophthalmic Drugs)   Current Outpatient Medications (Other)  Medication Sig  . alendronate (FOSAMAX) 70 MG tablet TAKE ONE TAB BY MOUTH ONCE WEEKLY WITH A FULL GLASS OF WATER  . calcium carbonate (OS-CAL) 1250 MG chewable tablet Chew 1 tablet by mouth daily.  . Multiple Vitamin (MULITIVITAMIN WITH MINERALS) TABS Take 1 tablet by mouth daily.   No current facility-administered medications for this visit. (Other)      REVIEW OF SYSTEMS:    ALLERGIES Allergies  Allergen Reactions  . Doxycycline Nausea And Vomiting    Other reaction(s): Dizziness (intolerance)    PAST MEDICAL HISTORY History reviewed. No pertinent past medical history. Past Surgical History:  Procedure Laterality Date  . ORIF ELBOW FRACTURE  08/19/2011   Procedure: OPEN REDUCTION INTERNAL FIXATION (ORIF) ELBOW/OLECRANON FRACTURE;  Surgeon: Karen Chafe, MD;  Location: [facility];  Service: Orthopedics;  Laterality: Right;    FAMILY  HISTORY Family History  Problem Relation Age of Onset  . Cancer Mother   . Healthy Father     SOCIAL HISTORY Social History   Tobacco Use  . Smoking status: Never Smoker  . Smokeless tobacco: Never Used  Vaping Use  . Vaping Use: Never used  Substance Use Topics  . Alcohol use: No  . Drug use: No         OPHTHALMIC EXAM:  Base Eye Exam    Visual Acuity (Snellen - Linear)      Right Left   Dist [place] 20/25 +2 20/200 +1   Dist ph [place]  20/40 +2       Tonometry (Tonopen, 2:30 PM)      Right Left   Pressure 17 14       Pupils      Pupils Dark Light Shape React APD   Right PERRL 5 4 Round Brisk None   Left PERRL 5 4 Round Brisk None       Visual Fields (Counting fingers)      Left Right    Full Full       Neuro/Psych    Oriented x3: Yes   Mood/Affect: Normal       Dilation    Both eyes: 1.0% Mydriacyl, 2.5% Phenylephrine @ 2:30 PM        Slit Lamp and Fundus Exam    External Exam      Right Left   External Normal Normal  Slit Lamp Exam      Right Left   Lids/Lashes Normal Normal   Conjunctiva/Sclera White and quiet White and quiet   Cornea Clear Clear   Anterior Chamber Deep and quiet Deep and quiet   Iris Round and reactive Round and reactive   Lens Centered posterior chamber intraocular lens, 2+ Nuclear sclerosis 2+ Nuclear sclerosis   Anterior Vitreous Normal Normal       Fundus Exam      Right Left   Posterior Vitreous Posterior vitreous detachment, Central vitreous floaters Posterior vitreous detachment, Central vitreous floaters   Disc Normal Normal   C/D Ratio .2 .2   Macula Normal Normal   Vessels Normal Normal   Periphery Normal, good retinopexy inferotemporal, and also superotemporal from prior pneumatic retinopexy OD no new breaks Normal no holes or breaks          IMAGING AND PROCEDURES  Imaging and Procedures for 10/07/20  Color Fundus Photography Optos - OU - Both Eyes       Right Eye Progression has been stable.  Macula : normal observations. Vessels : normal observations. Periphery : normal observations.   Left Eye Progression has been stable. Disc findings include normal observations. Macula : normal observations. Vessels : normal observations. Periphery : normal observations.   Notes Good retinopexy, no new retinal breaks in either eye.  OD remained stable after pneumatic retinopexy repair macula off detachment 2019                ASSESSMENT/PLAN:  Vitreous floaters of right eye Consequence of posterior vitreous detachment as well as retinal detachment repair OD via pneumatic retinopexy, not visually significant in the right eye.  Nuclear sclerotic cataract of left eye Follow-up Dr. Arnell Asal regarding cataract patient is having nighttime difficulties.  She also notes that she has monovision with the left eye being near her eye and that she is sort of prepared for monovision with pseudophakia, as to be discussed with Dr. Arnell Asal      ICD-10-CM   1. 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 Posterior vitreous detachment of left eye  H43.812 Color Fundus Photography Optos - OU - Both Eyes  3. Pseudophakia, right eye  Z96.1   4. Nuclear sclerotic cataract of left eye  H25.12   5. Vitreous floaters of right eye  H43.391     1.  Patient understands and promptly now notify Dr. Cathey Endow or this office should new onset visual acuity declines or difficulties or flashes and floaters develop  2.  Patient is eager to return to see Dr. Cathey Endow for consideration of cataract extraction with intraocular lens placement left eye  3.  Ophthalmic Meds Ordered this visit:  No orders of the defined types were placed in this encounter.      Return in about 1 year (around 10/07/2021) for DILATE OU, COLOR FP.  There are no Patient Instructions on file for this visit.   Explained the diagnoses, plan, and follow up with the patient and they expressed understanding.  Patient  expressed understanding of the importance of proper follow up care.   Alford Highland Rankin M.D. Diseases & Surgery of the Retina and Vitreous [facility] 10/07/20     Abbreviations: M myopia (nearsighted); A astigmatism; H hyperopia (farsighted); P presbyopia; Mrx spectacle prescription;  CTL contact lenses; OD right eye; OS left eye; OU both eyes  XT exotropia; ET esotropia; PEK punctate epithelial keratitis; PEE  punctate epithelial erosions; DES dry eye syndrome; MGD meibomian gland dysfunction; ATs artificial tears; PFAT's preservative free artificial tears; NSC nuclear sclerotic cataract; PSC posterior subcapsular cataract; ERM epi-retinal membrane; PVD posterior vitreous detachment; RD retinal detachment; DM diabetes mellitus; DR diabetic retinopathy; NPDR non-proliferative diabetic retinopathy; PDR proliferative diabetic retinopathy; CSME clinically significant macular edema; DME diabetic macular edema; dbh dot blot hemorrhages; CWS cotton wool spot; POAG primary open angle glaucoma; C/D cup-to-disc ratio; HVF humphrey visual field; GVF goldmann visual field; OCT optical coherence tomography; IOP intraocular pressure; BRVO Branch retinal vein occlusion; CRVO central retinal vein occlusion; CRAO central retinal artery occlusion; BRAO branch retinal artery occlusion; RT retinal tear; SB scleral buckle; PPV pars plana vitrectomy; VH Vitreous hemorrhage; PRP panretinal laser photocoagulation; IVK intravitreal kenalog; VMT vitreomacular traction; MH Macular hole;  NVD neovascularization of the disc; NVE neovascularization elsewhere; AREDS age related eye disease study; ARMD age related macular degeneration; POAG primary open angle glaucoma; EBMD epithelial/anterior basement membrane dystrophy; ACIOL anterior chamber intraocular lens; IOL intraocular lens; PCIOL posterior chamber intraocular lens; Phaco/IOL phacoemulsification with intraocular lens placement; PRK photorefractive keratectomy;  LASIK laser assisted in situ keratomileusis; HTN hypertension; DM diabetes mellitus; COPD chronic obstructive pulmonary disease

## 2020-10-07 NOTE — Assessment & Plan Note (Signed)
Follow-up Dr. Arnell Asal regarding cataract patient is having nighttime difficulties.  She also notes that she has monovision with the left eye being near her eye and that she is sort of prepared for monovision with pseudophakia, as to be discussed with Dr. Arnell Asal

## 2021-08-11 ENCOUNTER — Encounter: Payer: Self-pay | Admitting: Physical Therapy

## 2021-08-11 ENCOUNTER — Other Ambulatory Visit: Payer: Self-pay

## 2021-08-11 ENCOUNTER — Ambulatory Visit: Payer: Medicare Other | Attending: Sports Medicine | Admitting: Physical Therapy

## 2021-08-11 DIAGNOSIS — M25562 Pain in left knee: Secondary | ICD-10-CM | POA: Diagnosis present

## 2021-08-11 DIAGNOSIS — G8929 Other chronic pain: Secondary | ICD-10-CM | POA: Insufficient documentation

## 2021-08-11 DIAGNOSIS — M6281 Muscle weakness (generalized): Secondary | ICD-10-CM | POA: Diagnosis present

## 2021-08-11 DIAGNOSIS — M25561 Pain in right knee: Secondary | ICD-10-CM | POA: Diagnosis present

## 2021-08-11 NOTE — Therapy (Signed)
[place]. [place], [place], [address] Phone: [phone]   Fax:  [phone]  Physical Therapy Evaluation  Patient Details  Name: Julie Russell MRN: [id number] Date of Birth: 04-05-1951 Referring Provider (PT): Vickki Hearing   Encounter Date: 08/11/2021   PT End of Session - 08/11/21 1521     Visit Number 1    Number of Visits 13    Date for PT Re-Evaluation 09/22/21    Authorization Type MCR and GEHA    Authorization Time Period 08/11/21 to 09/22/21    Progress Note Due on Visit 10    PT Start Time 1404    PT Stop Time 1443    PT Time Calculation (min) 39 min    Activity Tolerance Patient tolerated treatment well    Behavior During Therapy [facility] for tasks assessed/performed             History reviewed. No pertinent past medical history.  Past Surgical History:  Procedure Laterality Date   ORIF ELBOW FRACTURE  08/19/2011   Procedure: OPEN REDUCTION INTERNAL FIXATION (ORIF) ELBOW/OLECRANON FRACTURE;  Surgeon: Paulene Floor, MD;  Location: [facility];  Service: Orthopedics;  Laterality: Right;    There were no vitals filed for this visit.    Subjective Assessment - 08/11/21 1406     Subjective I first started noticing my left knee bothering me about 3 years ago; right knee joined about 2 years ago. R hip was hurting before the R knee, I wonder if I was compensating. One's not worse than the other, but they can hurt in different places- right leg is lateral, left leg is in the back sometimes, sometimes pain is in the front. I can't attribute pain to anything specific I'm doing. Stairs are tough depending on how much I've done, one flight is OK but if I do more I feel it. I think part of it is fear of pain. My knees have buckled before, I had one fall 1.5 years ago doing a dance class. Repetitive motion bothers it. Never locks. No knee surgeries/injuries, no patella dislocations.    Patient Stated Goals  know safe ways to keep exercises and strengthening legs    Currently in Pain? Yes    Pain Score 1    at worst 8/10 and can wake her up; volataren helps in this case and can calm down in 20 minutes   Pain Location Knee    Pain Orientation Right;Left    Pain Descriptors / Indicators Aching    Pain Type Chronic pain    Pain Radiating Towards none    Pain Onset More than a month ago    Pain Frequency Intermittent    Aggravating Factors  lunging/some exercise movements, stairs    Pain Relieving Factors voltaren    Effect of Pain on Daily Activities moderate                [facility] PT Assessment - 08/11/21 0001       Assessment   Medical Diagnosis knee pain    Referring Provider (PT) Vickki Hearing    Onset Date/Surgical Date --   chronic   Next MD Visit Dr. Delilah Shan PRN    Prior Therapy PT for elbow      Precautions   Precautions None      Restrictions   Weight Bearing Restrictions No      Balance Screen   Has the patient fallen in the past 6 months  No    Has the patient had a decrease in activity level because of a fear of falling?  Yes    Is the patient reluctant to leave their home because of a fear of falling?  No      Home Ecologist residence      Prior Function   Level of Independence Independent;Independent with basic ADLs    Vocation Retired    Leisure being active, exercising, playing cards, travelling      Observation/Other Assessments   Focus on Therapeutic Outcomes (FOTO)  76      ROM / Strength   AROM / PROM / Strength Strength;AROM      AROM   AROM Assessment Site Knee    Right/Left Knee Right;Left    Right Knee Extension 4    Right Knee Flexion 138    Left Knee Extension 4    Left Knee Flexion 121      Strength   Strength Assessment Site Hip;Knee;Ankle    Right/Left Hip Right;Left    Right Hip Flexion 3+/5    Right Hip Extension 2+/5    Right Hip ABduction 3-/5    Left Hip Flexion 3+/5    Left Hip Extension 2+/5     Left Hip ABduction 3-/5    Right/Left Knee Left;Right    Right Knee Flexion 4/5    Right Knee Extension 4+/5    Left Knee Flexion 4/5    Left Knee Extension 4+/5    Right/Left Ankle Right;Left    Right Ankle Dorsiflexion 5/5    Right Ankle Plantar Flexion 4/5    Right Ankle Inversion 4+/5    Right Ankle Eversion 4+/5    Left Ankle Dorsiflexion 5/5    Left Ankle Plantar Flexion 4/5    Left Ankle Inversion 4+/5    Left Ankle Eversion 4/5      Flexibility   Soft Tissue Assessment /Muscle Length yes    Hamstrings severe limitation B    Quadriceps severe limitation B    Piriformis moderate limitation B      Palpation   Palpation comment no significant spasms or trigger points noted                        Objective measurements completed on examination: See above findings.       [facility] Adult PT Treatment/Exercise - 08/11/21 0001       Exercises   Exercises Knee/Hip      Knee/Hip Exercises: Stretches   Active Hamstring Stretch Right;1 rep;30 seconds    Piriformis Stretch Right;1 rep;30 seconds      Knee/Hip Exercises: Supine   Bridges Both;1 set;10 reps      Knee/Hip Exercises: Sidelying   Clams 1x10 red TB                     PT Education - 08/11/21 1520     Education Details exam findings, POC moving forward, HEP, likely cause of pain and how PT can help    Person(s) Educated Patient    Methods Explanation    Comprehension Verbalized understanding;Returned demonstration              PT Short Term Goals - 08/11/21 1524       PT SHORT TERM GOAL #1   Title Will be compliant with appropriate progressive HEP    Time 3    Period Weeks    Status New  Target Date 09/01/21      PT SHORT TERM GOAL #2   Title Knee pain to be no more than 4/10 at worst    Time 3    Period Weeks    Status New      PT SHORT TERM GOAL #3   Title Knee flexion and extension ROM to be symmetrical and equal    Time 3    Period Weeks    Status  New      PT SHORT TERM GOAL #4   Title Muscle flexibility to have improved by at least 50% to assist in reducing pain and compensations    Time 3    Period Weeks    Status New               PT Long Term Goals - 08/11/21 1525       PT LONG TERM GOAL #1   Title MMT to have improved by at least one grade in all weak groups    Time 6    Period Weeks    Status New    Target Date 09/22/21      PT LONG TERM GOAL #2   Title Will be able to squat and lunge with good form and without increase in knee pain    Time 6    Period Weeks    Status New      PT LONG TERM GOAL #3   Title Will report she has been able to complete all current workout routines without increase in pain and without feeling unsteady    Time 6    Period Weeks    Status New      PT LONG TERM GOAL #4   Title Will be independent with appropriate gym based exercise program to maintain functional gains and prevent recurrence of pain    Time 6    Period Weeks    Status New                    Plan - 08/11/21 1522     Clinical Impression Statement Pnina arrives today with ongoing knee pain which has been present for about the past 2-3 years. Per her description, it sounds very possibly like arthritic pain. Examination reveals significant functional mm weakness and impaired mm flexibility, also limited knee ROM. I am hopeful that we are catching this early enough that we will be able to significantly reduce pain and symptoms. Will progress as able and tolerated moving forward.    Personal Factors and Comorbidities Age;Time since onset of injury/illness/exacerbation    Examination-Activity Limitations Locomotion Level;Squat;Stairs;Transfers    Examination-Participation Restrictions Community Activity;Yard Work    Stability/Clinical Decision Making Stable/Uncomplicated    Designer, jewellery Low    Rehab Potential Excellent    PT Frequency 2x / week    PT Duration 6 weeks    PT  Treatment/Interventions ADLs/Self Care Home Management;Cryotherapy;Electrical Stimulation;Iontophoresis 4mg /ml Dexamethasone;Moist Heat;Ultrasound;Gait training;Stair training;Functional mobility training;Therapeutic activities;Therapeutic exercise;Balance training;Neuromuscular re-education;Patient/family education;Orthotic Fit/Training;Manual techniques;Passive range of motion;Dry needling;Energy conservation;Taping;Vasopneumatic Device    PT Next Visit Plan review HEP, assess and progress strength and flexibility .    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. [place], [place], [address] Phone: [phone]   Fax:  [phone]  Name: Katyana Naccarato MRN: [id number] Date of Birth: [birth year]

## 2021-08-15 ENCOUNTER — Ambulatory Visit: Payer: Medicare Other | Admitting: Physical Therapy

## 2021-08-15 ENCOUNTER — Encounter: Payer: Self-pay | Admitting: Physical Therapy

## 2021-08-15 ENCOUNTER — Other Ambulatory Visit: Payer: Self-pay

## 2021-08-15 DIAGNOSIS — M25562 Pain in left knee: Secondary | ICD-10-CM | POA: Diagnosis not present

## 2021-08-15 DIAGNOSIS — G8929 Other chronic pain: Secondary | ICD-10-CM

## 2021-08-15 DIAGNOSIS — M25561 Pain in right knee: Secondary | ICD-10-CM

## 2021-08-15 DIAGNOSIS — M6281 Muscle weakness (generalized): Secondary | ICD-10-CM

## 2021-08-15 NOTE — Patient Instructions (Signed)
Access Code: [email or web address] URL: [email or web address] Date: 08/15/2021 Prepared by: Oley Balm  Exercises Supine Bridge - 2 x daily - 7 x weekly - 1 sets - 10 reps - 3 hold Seated Hamstring Stretch - 2 x daily - 7 x weekly - 1 sets - 3 reps - 30 hold Seated Piriformis Stretch - 2 x daily - 7 x weekly - 1 sets - 3 reps - 30 hold Clamshell with Resistance - 2 x daily - 7 x weekly - 1 sets - 10 reps - 2 hold Supine Hamstring Stretch with Strap - 1 x daily - 7 x weekly - 3 sets - 30 hold Supine ITB Stretch with Strap - 1 x daily - 7 x weekly - 3 sets - 30 hold Hip Adductors and Hamstring Stretch with Strap - 1 x daily - 7 x weekly - 3 sets - 30 hold Side Stepping with Resistance at Feet - 1 x daily - 7 x weekly - 2 sets - 10 reps

## 2021-08-15 NOTE — Therapy (Signed)
[facility] [place] [address]. [place], [place], [address] Phone: [phone]   Fax:  [phone]  Physical Therapy Treatment  Patient Details  Name: Julie Russell MRN: [id number] Date of Birth: [birth year] Referring Provider (PT): Rodolph Bong   Encounter Date: 08/15/2021   PT End of Session - 08/15/21 1628     Visit Number 2    Number of Visits 13    Date for PT Re-Evaluation 09/22/21    Authorization Type MCR and GEHA    Authorization Time Period 08/11/21 to 09/22/21    Progress Note Due on Visit 10    PT Start Time 1545    PT Stop Time 1627    PT Time Calculation (min) 42 min    Activity Tolerance Patient tolerated treatment well    Behavior During Therapy [facility] for tasks assessed/performed             History reviewed. No pertinent past medical history.  Past Surgical History:  Procedure Laterality Date   ORIF ELBOW FRACTURE  08/19/2011   Procedure: OPEN REDUCTION INTERNAL FIXATION (ORIF) ELBOW/OLECRANON FRACTURE;  Surgeon: Karen Chafe, MD;  Location: [facility];  Service: Orthopedics;  Laterality: Right;    There were no vitals filed for this visit.   Subjective Assessment - 08/15/21 1546     Subjective Patient reports that she is performing her HEP and has some mild pain from the strengthening.    Currently in Pain? Yes    Pain Score 4     Pain Location Knee    Pain Orientation Left    Pain Descriptors / Indicators Aching    Pain Type Chronic pain    Pain Onset More than a month ago    Pain Frequency Intermittent                               [facility] Adult PT Treatment/Exercise - 08/15/21 0001       Knee/Hip Exercises: Stretches   Passive Hamstring Stretch Right;1 rep;30 seconds    Passive Hamstring Stretch Limitations strap-also stretch hip abd/add x 30 sec    Piriformis Stretch Left;30 seconds;1 rep    Other Knee/Hip Stretches Foam rolling for gluts, piriformis, ITB,  emphasizing R.      Knee/Hip Exercises: Aerobic   Nustep L5 x 6 minutes      Knee/Hip Exercises: Standing   Hip Abduction Stengthening;Both;1 set;10 reps    Abduction Limitations Chilton Si Theraband was too much resistance                     PT Education - 08/15/21 1628     Education Details Updated HEP, foam rolling    Person(s) Educated Patient    Methods Explanation;Demonstration;Handout    Comprehension Returned demonstration;Verbalized understanding              PT Short Term Goals - 08/11/21 1524       PT SHORT TERM GOAL #1   Title Will be compliant with appropriate progressive HEP    Time 3    Period Weeks    Status New    Target Date 09/01/21      PT SHORT TERM GOAL #2   Title Knee pain to be no more than 4/10 at worst    Time 3    Period Weeks    Status New      PT SHORT TERM GOAL #3  Title Knee flexion and extension ROM to be symmetrical and equal    Time 3    Period Weeks    Status New      PT SHORT TERM GOAL #4   Title Muscle flexibility to have improved by at least 50% to assist in reducing pain and compensations    Time 3    Period Weeks    Status New               PT Long Term Goals - 08/11/21 1525       PT LONG TERM GOAL #1   Title MMT to have improved by at least one grade in all weak groups    Time 6    Period Weeks    Status New    Target Date 09/22/21      PT LONG TERM GOAL #2   Title Will be able to squat and lunge with good form and without increase in knee pain    Time 6    Period Weeks    Status New      PT LONG TERM GOAL #3   Title Will report she has been able to complete all current workout routines without increase in pain and without feeling unsteady    Time 6    Period Weeks    Status New      PT LONG TERM GOAL #4   Title Will be independent with appropriate gym based exercise program to maintain functional gains and prevent recurrence of pain    Time 6    Period Weeks    Status New                    Plan - 08/15/21 1629     Clinical Impression Statement Patietn reports she is performing HEP, surprised at how her muscles shake. Therapsit performed ST assessment with R piriformis and ITB tight and TTP. Educated her and had her practice foam rolling to R side and also demosntrated tennis ball massage to ITB. Then performed stretch for ITB and hip muscles and added hip abd strengthening to HEP.    Personal Factors and Comorbidities Age;Time since onset of injury/illness/exacerbation    Examination-Activity Limitations Locomotion Level;Squat;Stairs;Transfers    Examination-Participation Restrictions Community Activity;Yard Work    Stability/Clinical Decision Making Stable/Uncomplicated    Optometrist Low    Rehab Potential Excellent    PT Frequency 2x / week    PT Duration 6 weeks    PT Treatment/Interventions ADLs/Self Care Home Management;Cryotherapy;Electrical Stimulation;Iontophoresis 4mg /ml Dexamethasone;Moist Heat;Ultrasound;Gait training;Stair training;Functional mobility training;Therapeutic activities;Therapeutic exercise;Balance training;Neuromuscular re-education;Patient/family education;Orthotic Fit/Training;Manual techniques;Passive range of motion;Dry needling;Energy conservation;Taping;Vasopneumatic Device    PT Next Visit Plan Progress with core and hip strengthening.    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 [place], [place], [address] Phone: [phone]   Fax:  [phone]  Name: Arlean HoppingLeslie Vise MRN: [id number] Date of Birth: [birth year]

## 2021-08-19 ENCOUNTER — Ambulatory Visit: Payer: Medicare Other | Attending: Sports Medicine | Admitting: Physical Therapy

## 2021-08-19 ENCOUNTER — Other Ambulatory Visit: Payer: Self-pay

## 2021-08-19 ENCOUNTER — Encounter: Payer: Self-pay | Admitting: Physical Therapy

## 2021-08-19 DIAGNOSIS — M25561 Pain in right knee: Secondary | ICD-10-CM | POA: Insufficient documentation

## 2021-08-19 DIAGNOSIS — G8929 Other chronic pain: Secondary | ICD-10-CM | POA: Diagnosis present

## 2021-08-19 DIAGNOSIS — M6281 Muscle weakness (generalized): Secondary | ICD-10-CM | POA: Diagnosis present

## 2021-08-19 DIAGNOSIS — M25562 Pain in left knee: Secondary | ICD-10-CM | POA: Diagnosis present

## 2021-08-19 NOTE — Therapy (Signed)
[facility] [address] [place], [place], [address] Phone: [phone]   Fax:  [phone]  Physical Therapy Treatment  Patient Details  Name: Julie Russell MRN: [id number] Date of Birth: [birth year] Referring Provider (PT): Rodolph Bong   Encounter Date: 08/19/2021   PT End of Session - 08/19/21 1054     Visit Number 3    Number of Visits 13    Date for PT Re-Evaluation 09/22/21    Authorization Type MCR and GEHA    Authorization Time Period 08/11/21 to 09/22/21    Progress Note Due on Visit 10    PT Start Time 1015    PT Stop Time 1054    PT Time Calculation (min) 39 min    Activity Tolerance Patient tolerated treatment well    Behavior During Therapy [facility] for tasks assessed/performed             History reviewed. No pertinent past medical history.  Past Surgical History:  Procedure Laterality Date   ORIF ELBOW FRACTURE  08/19/2011   Procedure: OPEN REDUCTION INTERNAL FIXATION (ORIF) ELBOW/OLECRANON FRACTURE;  Surgeon: Karen Chafe, MD;  Location: [facility];  Service: Orthopedics;  Laterality: Right;    There were no vitals filed for this visit.   Subjective Assessment - 08/19/21 1020     Subjective Patient reports her knees are sore, especially with the cold weather. Her hip feels better.    Patient Stated Goals know safe ways to keep exercises and strengthening legs    Currently in Pain? Yes    Pain Score 4     Pain Location Knee    Pain Orientation Left;Distal;Lateral    Pain Descriptors / Indicators Aching    Pain Type Chronic pain    Pain Onset More than a month ago    Pain Frequency Intermittent    Aggravating Factors  weather.                               [facility] Adult PT Treatment/Exercise - 08/19/21 0001       Knee/Hip Exercises: Aerobic   Nustep L5 x 6 minutes      Knee/Hip Exercises: Standing   Lateral Step Up Both;1 set;10 reps      Knee/Hip Exercises: Supine    Bridges Both;1 set;10 reps    Bridges Limitations Emphasize pelvic tilt and active/stable core.    Bridges with Clamshell Strengthening;Both;1 set;10 reps    Straight Leg Raises Strengthening;Both;2 sets;10 reps    Straight Leg Raises Limitations 2#    Straight Leg Raise with External Rotation Strengthening;Both;2 sets;10 reps    Straight Leg Raise with External Rotation Limitations 2#    Other Supine Knee/Hip Exercises ITB stretch with R leg crossed over L, 30 seconds.                     PT Education - 08/19/21 1054     Education Details Updated HEP, pelvic tilts and protecting from back pain.    Person(s) Educated Patient    Methods Explanation;Demonstration;Handout    Comprehension Verbalized understanding;Returned demonstration              PT Short Term Goals - 08/11/21 1524       PT SHORT TERM GOAL #1   Title Will be compliant with appropriate progressive HEP    Time 3    Period Weeks    Status  New    Target Date 09/01/21      PT SHORT TERM GOAL #2   Title Knee pain to be no more than 4/10 at worst    Time 3    Period Weeks    Status New      PT SHORT TERM GOAL #3   Title Knee flexion and extension ROM to be symmetrical and equal    Time 3    Period Weeks    Status New      PT SHORT TERM GOAL #4   Title Muscle flexibility to have improved by at least 50% to assist in reducing pain and compensations    Time 3    Period Weeks    Status New               PT Long Term Goals - 08/11/21 1525       PT LONG TERM GOAL #1   Title MMT to have improved by at least one grade in all weak groups    Time 6    Period Weeks    Status New    Target Date 09/22/21      PT LONG TERM GOAL #2   Title Will be able to squat and lunge with good form and without increase in knee pain    Time 6    Period Weeks    Status New      PT LONG TERM GOAL #3   Title Will report she has been able to complete all current workout routines without increase in  pain and without feeling unsteady    Time 6    Period Weeks    Status New      PT LONG TERM GOAL #4   Title Will be independent with appropriate gym based exercise program to maintain functional gains and prevent recurrence of pain    Time 6    Period Weeks    Status New                   Plan - 08/19/21 1055     Clinical Impression Statement Patient reports improved R hip pain, but knees continue to be painful, weather appears to be a factor. Perofrmed ITB stretch and updated HEP to emphasize hip stability, while protecting back as she has reported back pain. Much improved with stable core. She reported slight knee pain after lateral step ups, will monitor for pain with HEP.    Personal Factors and Comorbidities Age;Time since onset of injury/illness/exacerbation    Examination-Activity Limitations Locomotion Level;Squat;Stairs;Transfers    Examination-Participation Restrictions Community Activity;Yard Work    Stability/Clinical Decision Making Stable/Uncomplicated    OptometristClinical Decision Making Low    Rehab Potential Excellent    PT Frequency 2x / week    PT Duration 6 weeks    PT Treatment/Interventions ADLs/Self Care Home Management;Cryotherapy;Electrical Stimulation;Iontophoresis 4mg /ml Dexamethasone;Moist Heat;Ultrasound;Gait training;Stair training;Functional mobility training;Therapeutic activities;Therapeutic exercise;Balance training;Neuromuscular re-education;Patient/family education;Orthotic Fit/Training;Manual techniques;Passive range of motion;Dry needling;Energy conservation;Taping;Vasopneumatic Device    PT Next Visit Plan Progress with core and hip strengthening.    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 [place], [place], [address] Phone: [phone]   Fax:  [phone]  Name: Julie Russell MRN: [id number] Date of Birth: January 04, 1951

## 2021-08-19 NOTE — Patient Instructions (Signed)
Access Code: [email or web address] URL: [email or web address] Date: 08/19/2021 Prepared by: Oley Balm  Exercises Supine Hamstring Stretch with Strap - 1 x daily - 7 x weekly - 3 sets - 30 hold Supine ITB Stretch with Strap - 1 x daily - 7 x weekly - 3 sets - 30 hold Hip Adductors and Hamstring Stretch with Strap - 1 x daily - 7 x weekly - 3 sets - 30 hold Supine ITB Stretch - 1 x daily - 7 x weekly - 3 sets - 30 hold Supine Bridge with Pelvic Floor Contraction - 1 x daily - 7 x weekly - 1 sets - 10 reps Bridge with Resistance - 1 x daily - 7 x weekly - 1 sets - 10 reps Supine Pelvic Tilt with Straight Leg Raise - 1 x daily - 7 x weekly - 2 sets - 10 reps Straight Leg Raise with External Rotation - 1 x daily - 7 x weekly - 2 sets - 10 reps Side Stepping with Resistance at Ankles - 1 x daily - 7 x weekly - 2 sets - 10 reps Lateral Step Up with Counter Support - 1 x daily - 7 x weekly - 2 sets - 10 reps

## 2021-08-22 ENCOUNTER — Ambulatory Visit: Payer: Medicare Other | Admitting: Physical Therapy

## 2021-08-24 ENCOUNTER — Encounter: Payer: Self-pay | Admitting: Physical Therapy

## 2021-08-24 ENCOUNTER — Ambulatory Visit: Payer: Medicare Other | Admitting: Physical Therapy

## 2021-08-24 ENCOUNTER — Other Ambulatory Visit: Payer: Self-pay

## 2021-08-24 DIAGNOSIS — M6281 Muscle weakness (generalized): Secondary | ICD-10-CM

## 2021-08-24 DIAGNOSIS — G8929 Other chronic pain: Secondary | ICD-10-CM

## 2021-08-24 DIAGNOSIS — M25562 Pain in left knee: Secondary | ICD-10-CM | POA: Diagnosis not present

## 2021-08-24 DIAGNOSIS — M25561 Pain in right knee: Secondary | ICD-10-CM

## 2021-08-24 NOTE — Therapy (Signed)
[facility] [place] [facility] [address] [place], [place], [address] Phone: [phone]   Fax:  [phone]  Physical Therapy Treatment  Patient Details  Name: Julie Russell MRN: [id number] Date of Birth: 1950/09/23 Referring Provider (PT): Rodolph Bong   Encounter Date: 08/24/2021   PT End of Session - 08/24/21 1545     Visit Number 4    Number of Visits 13    Date for PT Re-Evaluation 09/22/21    Authorization Type MCR and GEHA    Authorization Time Period 08/11/21 to 09/22/21    Progress Note Due on Visit 10    PT Start Time 1452    PT Stop Time 1532    PT Time Calculation (min) 40 min    Activity Tolerance Patient tolerated treatment well    Behavior During Therapy [facility] for tasks assessed/performed             History reviewed. No pertinent past medical history.  Past Surgical History:  Procedure Laterality Date   ORIF ELBOW FRACTURE  08/19/2011   Procedure: OPEN REDUCTION INTERNAL FIXATION (ORIF) ELBOW/OLECRANON FRACTURE;  Surgeon: Karen Chafe, MD;  Location: [facility];  Service: Orthopedics;  Laterality: Right;    There were no vitals filed for this visit.   Subjective Assessment - 08/24/21 1454     Subjective Its been a crazy few days, thought I had an eye infection for awhile but my eye doctor cleared me to be around people again, not contagious. My hip and R knee feels better but I'm still having intermittent pain in my left knee but I'm slow getting up after I've been sitting for awhile.    Patient Stated Goals know safe ways to keep exercises and strengthening legs    Currently in Pain? No/denies                               [facility] Adult PT Treatment/Exercise - 08/24/21 0001       Knee/Hip Exercises: Aerobic   Nustep L5 x3 minutes, then L6 x2 minutes BLEs only      Knee/Hip Exercises: Standing   Heel Raises Both;1 set;10 reps    Forward Lunges Both;1 set;10 reps    Forward Lunges  Limitations 4 inch box    Other Standing Knee Exercises wall squats 1x10 3 second holds      Knee/Hip Exercises: Supine   Bridges Both;1 set;15 reps    Bridges Limitations with green TB above knees    Bridges with Clamshell Strengthening;Both;1 set;15 reps   green TB   Other Supine Knee/Hip Exercises clamshells 1x15 green TB    Other Supine Knee/Hip Exercises walking bridge 1x10            Step ups on 6 inch box 1x10 B           PT Education - 08/24/21 1545     Education Details hold off on adding any exercises from today until at least Saturday so we can make sure they are not making her too sore or causing other issues. OK to add bridge sequence and wall squats during vacation if she does feel alright.    Person(s) Educated Patient    Methods Explanation    Comprehension Verbalized understanding              PT Short Term Goals - 08/11/21 1524       PT SHORT TERM GOAL #  1   Title Will be compliant with appropriate progressive HEP    Time 3    Period Weeks    Status New    Target Date 09/01/21      PT SHORT TERM GOAL #2   Title Knee pain to be no more than 4/10 at worst    Time 3    Period Weeks    Status New      PT SHORT TERM GOAL #3   Title Knee flexion and extension ROM to be symmetrical and equal    Time 3    Period Weeks    Status New      PT SHORT TERM GOAL #4   Title Muscle flexibility to have improved by at least 50% to assist in reducing pain and compensations    Time 3    Period Weeks    Status New               PT Long Term Goals - 08/11/21 1525       PT LONG TERM GOAL #1   Title MMT to have improved by at least one grade in all weak groups    Time 6    Period Weeks    Status New    Target Date 09/22/21      PT LONG TERM GOAL #2   Title Will be able to squat and lunge with good form and without increase in knee pain    Time 6    Period Weeks    Status New      PT LONG TERM GOAL #3   Title Will report she has been  able to complete all current workout routines without increase in pain and without feeling unsteady    Time 6    Period Weeks    Status New      PT LONG TERM GOAL #4   Title Will be independent with appropriate gym based exercise program to maintain functional gains and prevent recurrence of pain    Time 6    Period Weeks    Status New                   Plan - 08/24/21 1546     Clinical Impression Statement Julie Russell arrives today doing well- has really only had a few sessions but is already noticing some good changes. We continued working on hip strength and stability on the table, encouraged her to continue working on foam rolling as well especially in IT band region. I gave her a green band to use for HEP at home since she is up to 20 reps with red TB. Also introduced more standing CKC strength today with good tolerance. She is going on vacation next week, I did ask her to hold off on adding any exercises from today until at least Saturday so we can make sure they are not making her too sore or causing other issues. OK to add bridge sequence and wall squats during vacation if she does feel alright.    Personal Factors and Comorbidities Age;Time since onset of injury/illness/exacerbation    Examination-Activity Limitations Locomotion Level;Squat;Stairs;Transfers    Examination-Participation Restrictions Community Activity;Yard Work    Stability/Clinical Decision Making Stable/Uncomplicated    Optometrist Low    Rehab Potential Excellent    PT Frequency 2x / week    PT Duration 6 weeks    PT Treatment/Interventions ADLs/Self Care Home Management;Cryotherapy;Electrical Stimulation;Iontophoresis 4mg /ml Dexamethasone;Moist Heat;Ultrasound;Gait training;Stair training;Functional mobility training;Therapeutic  activities;Therapeutic exercise;Balance training;Neuromuscular re-education;Patient/family education;Orthotic Fit/Training;Manual techniques;Passive range of motion;Dry  needling;Energy conservation;Taping;Vasopneumatic Device    PT Next Visit Plan Progress with core and hip strengthening. Continue standing exercises as tolerated    PT Home Exercise Plan [email or web address]    Consulted and Agree with Plan of Care Patient             Patient will benefit from skilled therapeutic intervention in order to improve the following deficits and impairments:  Decreased range of motion, Pain, Hypomobility, Impaired flexibility, Decreased strength, Decreased mobility  Visit Diagnosis: Chronic pain of left knee  Chronic pain of right knee  Muscle weakness (generalized)     Problem List Patient Active Problem List   Diagnosis Date Noted   Retinal detachment of right eye with single break 10/07/2020   Posterior vitreous detachment of left eye 10/07/2020   Pseudophakia, right eye 10/07/2020   Nuclear sclerotic cataract of left eye 10/07/2020   Vitreous floaters of right eye 10/07/2020   Lerry Liner PT, DPT, PN2   Supplemental Physical Therapist [facility]       [facility] [address] [place], [place], [address] Phone: [phone]   Fax:  [phone]  Name: Julie Russell MRN: [id number] Date of Birth: 1951-07-10

## 2021-09-05 ENCOUNTER — Other Ambulatory Visit: Payer: Self-pay

## 2021-09-05 ENCOUNTER — Encounter: Payer: Self-pay | Admitting: Physical Therapy

## 2021-09-05 ENCOUNTER — Ambulatory Visit: Payer: Medicare Other | Admitting: Physical Therapy

## 2021-09-05 DIAGNOSIS — G8929 Other chronic pain: Secondary | ICD-10-CM

## 2021-09-05 DIAGNOSIS — M6281 Muscle weakness (generalized): Secondary | ICD-10-CM

## 2021-09-05 DIAGNOSIS — M25562 Pain in left knee: Secondary | ICD-10-CM | POA: Diagnosis not present

## 2021-09-05 NOTE — Therapy (Signed)
[place]. [place], [place], [address] Phone: [phone]   Fax:  [phone]  Physical Therapy Treatment  Patient Details  Name: Julie [facility] MRN: [id number] Date of Birth: December 29, 1950 Referring Provider (PT): Vickki Hearing   Encounter Date: 09/05/2021   PT End of Session - 09/05/21 1530     Visit Number 5    Number of Visits 13    Date for PT Re-Evaluation 09/22/21    Authorization Type MCR and GEHA    Authorization Time Period 08/11/21 to 09/22/21    Progress Note Due on Visit 10    PT Start Time 1448    PT Stop Time 1529    PT Time Calculation (min) 41 min    Activity Tolerance Patient tolerated treatment well    Behavior During Therapy [facility] for tasks assessed/performed             History reviewed. No pertinent past medical history.  Past Surgical History:  Procedure Laterality Date   ORIF ELBOW FRACTURE  08/19/2011   Procedure: OPEN REDUCTION INTERNAL FIXATION (ORIF) ELBOW/OLECRANON FRACTURE;  Surgeon: Paulene Floor, MD;  Location: [facility];  Service: Orthopedics;  Laterality: Right;    There were no vitals filed for this visit.   Subjective Assessment - 09/05/21 1449     Subjective I have a little bit more pain from sitting driving so much during vacation, just more stiff and achey. Different than exercise pain. Hip feels better. Pain did wake me up a couple nights.    Patient Stated Goals know safe ways to keep exercises and strengthening legs    Currently in Pain? No/denies   can get to 3/10 in knees at worst especially at night                              [facility] Adult PT Treatment/Exercise - 09/05/21 0001       Knee/Hip Exercises: Aerobic   Nustep L5 x5 minutes, L 6 x2.5 min BLEs only      Knee/Hip Exercises: Standing   Forward Step Up Both;1 set;10 reps    Forward Step Up Limitations 6 inch step    Step Down Both;1 set;10 reps    Step Down Limitations 4  inch step    Other Standing Knee Exercises wall squats 1x5 3 second holds    Other Standing Knee Exercises hip hikes 2x10 iwth and without swing; sls with slightly bent knee 5 second holds 1x10 B very light BUE support;      Knee/Hip Exercises: Seated   Other Seated Knee/Hip Exercises blue physiodisc: TA set with seated marches 1x20; then opposite UE/LE 1x20; lateral TA crunches 1x10      Knee/Hip Exercises: Supine   Other Supine Knee/Hip Exercises core exercises: double leg lift with posterior pelvic tilt/TA set 1x10; dead bugs 1x10 B with posterior pelvic tilt/TA set    Other Supine Knee/Hip Exercises walking bridge 1x15      Knee/Hip Exercises: Sidelying   Hip ABduction Both;1 set;10 reps                     PT Education - 09/05/21 1529     Education Details exercise form and purpose    Person(s) Educated Patient    Methods Explanation    Comprehension Verbalized understanding              PT Short Term  Goals - 08/11/21 1524       PT SHORT TERM GOAL #1   Title Will be compliant with appropriate progressive HEP    Time 3    Period Weeks    Status New    Target Date 09/01/21      PT SHORT TERM GOAL #2   Title Knee pain to be no more than 4/10 at worst    Time 3    Period Weeks    Status New      PT SHORT TERM GOAL #3   Title Knee flexion and extension ROM to be symmetrical and equal    Time 3    Period Weeks    Status New      PT SHORT TERM GOAL #4   Title Muscle flexibility to have improved by at least 50% to assist in reducing pain and compensations    Time 3    Period Weeks    Status New               PT Long Term Goals - 08/11/21 1525       PT LONG TERM GOAL #1   Title MMT to have improved by at least one grade in all weak groups    Time 6    Period Weeks    Status New    Target Date 09/22/21      PT LONG TERM GOAL #2   Title Will be able to squat and lunge with good form and without increase in knee pain    Time 6    Period  Weeks    Status New      PT LONG TERM GOAL #3   Title Will report she has been able to complete all current workout routines without increase in pain and without feeling unsteady    Time 6    Period Weeks    Status New      PT LONG TERM GOAL #4   Title Will be independent with appropriate gym based exercise program to maintain functional gains and prevent recurrence of pain    Time 6    Period Weeks    Status New                   Plan - 09/05/21 1530     Clinical Impression Statement Julie Russell arrives today doing well, was able to keep up with HEP actually quite well while on vacation. Having a bit more knee stiffness especially at night but it does get better when she exercises. Continued progressing exercise as tolerated, seems to still be doing well moving forward but still needs focused strengthening and mm endurance training. Did have a lot more joint noise than previously with some exercises today.    Personal Factors and Comorbidities Age;Time since onset of injury/illness/exacerbation    Examination-Activity Limitations Locomotion Level;Squat;Stairs;Transfers    Examination-Participation Restrictions Community Activity;Yard Work    Stability/Clinical Decision Making Stable/Uncomplicated    Designer, jewellery Low    Rehab Potential Excellent    PT Frequency 2x / week    PT Duration 6 weeks    PT Treatment/Interventions ADLs/Self Care Home Management;Cryotherapy;Electrical Stimulation;Iontophoresis 4mg /ml Dexamethasone;Moist Heat;Ultrasound;Gait training;Stair training;Functional mobility training;Therapeutic activities;Therapeutic exercise;Balance training;Neuromuscular re-education;Patient/family education;Orthotic Fit/Training;Manual techniques;Passive range of motion;Dry needling;Energy conservation;Taping;Vasopneumatic Device    PT Next Visit Plan Progress with core and hip strengthening. 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 [place], [place], [address] Phone: [phone]   Fax:  [phone]  Name: Julie Russell MRN: [id number] Date of Birth: 1950-12-29

## 2021-09-07 ENCOUNTER — Encounter: Payer: Self-pay | Admitting: Physical Therapy

## 2021-09-07 ENCOUNTER — Other Ambulatory Visit: Payer: Self-pay

## 2021-09-07 ENCOUNTER — Ambulatory Visit: Payer: Medicare Other | Admitting: Physical Therapy

## 2021-09-07 DIAGNOSIS — M25562 Pain in left knee: Secondary | ICD-10-CM

## 2021-09-07 DIAGNOSIS — M6281 Muscle weakness (generalized): Secondary | ICD-10-CM

## 2021-09-07 DIAGNOSIS — G8929 Other chronic pain: Secondary | ICD-10-CM

## 2021-09-07 NOTE — Therapy (Signed)
[facility] [place] [address]. [place], [place], [address] Phone: [phone]   Fax:  [phone]  Physical Therapy Treatment  Patient Details  Name: Julie [facility] MRN: [id number] Date of Birth: 07/31/1950 Referring Provider (PT): Rodolph Bong   Encounter Date: 09/07/2021   PT End of Session - 09/07/21 1526     Visit Number 6    Number of Visits 13    Date for PT Re-Evaluation 09/22/21    Authorization Type MCR and GEHA    Authorization Time Period 08/11/21 to 09/22/21    Progress Note Due on Visit 10    PT Start Time 1447    PT Stop Time 1527    PT Time Calculation (min) 40 min    Activity Tolerance Patient tolerated treatment well    Behavior During Therapy Julie Russell for tasks assessed/performed             History reviewed. No pertinent past medical history.  Past Surgical History:  Procedure Laterality Date   ORIF ELBOW FRACTURE  08/19/2011   Procedure: OPEN REDUCTION INTERNAL FIXATION (ORIF) ELBOW/OLECRANON FRACTURE;  Surgeon: Karen Chafe, MD;  Location: [facility];  Service: Orthopedics;  Laterality: Right;    There were no vitals filed for this visit.   Subjective Assessment - 09/07/21 1449     Subjective I felt ok after last time; there must be a change in the weather coming, my legs have felt shakier    Patient Stated Goals know safe ways to keep exercises and strengthening legs    Currently in Pain? No/denies                               [facility] Adult PT Treatment/Exercise - 09/07/21 0001       Knee/Hip Exercises: Aerobic   Tread Mill TM 1.1-1. warm up at 0% incline; 2% grade for 1 minute; 4% grade for another minute; then an additional minute at 6%, 8%, and finished at 10% grade      Knee/Hip Exercises: Standing   Forward Lunges Both;1 set;15 reps    Forward Lunges Limitations 4 inch step    Step Down Both;2 sets;10 reps    Step Down Limitations 4 inch step    Other Standing  Knee Exercises wall squats 1/3 depth 10x10 second holds; SLS on BOSU with 5 second holds x10B;    Other Standing Knee Exercises modified back lunges (8 inch box + blue foam pad) 2x5 U UE support      Knee/Hip Exercises: Supine   Other Supine Knee/Hip Exercises core exercises; double bent leg lifts with posterior pelvic tilt 1x15; dead bugs with straight legs 1x15      Knee/Hip Exercises: Prone   Other Prone Exercises quadruped: TA sets 1x15 3 second holds; UE raises 1x10 B with TA set; LE raises 1x10 B wtih TA set; bird dogs 1x5 B with TA set                     PT Education - 09/07/21 1526     Education Details exercise form and purpose    Person(s) Educated Patient    Methods Explanation    Comprehension Verbalized understanding              PT Short Term Goals - 08/11/21 1524       PT SHORT TERM GOAL #1   Title Will be compliant with  appropriate progressive HEP    Time 3    Period Weeks    Status New    Target Date 09/01/21      PT SHORT TERM GOAL #2   Title Knee pain to be no more than 4/10 at worst    Time 3    Period Weeks    Status New      PT SHORT TERM GOAL #3   Title Knee flexion and extension ROM to be symmetrical and equal    Time 3    Period Weeks    Status New      PT SHORT TERM GOAL #4   Title Muscle flexibility to have improved by at least 50% to assist in reducing pain and compensations    Time 3    Period Weeks    Status New               PT Long Term Goals - 08/11/21 1525       PT LONG TERM GOAL #1   Title MMT to have improved by at least one grade in all weak groups    Time 6    Period Weeks    Status New    Target Date 09/22/21      PT LONG TERM GOAL #2   Title Will be able to squat and lunge with good form and without increase in knee pain    Time 6    Period Weeks    Status New      PT LONG TERM GOAL #3   Title Will report she has been able to complete all current workout routines without increase in pain  and without feeling unsteady    Time 6    Period Weeks    Status New      PT LONG TERM GOAL #4   Title Will be independent with appropriate gym based exercise program to maintain functional gains and prevent recurrence of pain    Time 6    Period Weeks    Status New                   Plan - 09/07/21 1527     Clinical Impression Statement Julie [facility] arrives today doing OK, knees are bothering her a bit but this may be due to weather change. Introduced some incline work on the treadmill, then continued with functional core strengthening as well as closed chain strength this afternoon. Did well, does continue to have limited eccentric isometric and eccentric strength especially in quads. Will continue efforts.    Personal Factors and Comorbidities Age;Time since onset of injury/illness/exacerbation    Examination-Activity Limitations Locomotion Level;Squat;Stairs;Transfers    Examination-Participation Restrictions Community Activity;Yard Work    Stability/Clinical Decision Making Stable/Uncomplicated    Optometrist Low    Rehab Potential Excellent    PT Frequency 2x / week    PT Duration 6 weeks    PT Treatment/Interventions ADLs/Self Care Home Management;Cryotherapy;Electrical Stimulation;Iontophoresis 4mg /ml Dexamethasone;Moist Heat;Ultrasound;Gait training;Stair training;Functional mobility training;Therapeutic activities;Therapeutic exercise;Balance training;Neuromuscular re-education;Patient/family education;Orthotic Fit/Training;Manual techniques;Passive range of motion;Dry needling;Energy conservation;Taping;Vasopneumatic Device    PT Next Visit Plan Progress with core and hip strengthening. Continue standing exercises as tolerated. 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 [place], [place], [address] Phone: [phone]   Fax:  [phone]  Name: Clemie Elswick MRN: [id number] Date of Birth: 12-14-1950

## 2021-09-12 ENCOUNTER — Encounter: Payer: Self-pay | Admitting: Physical Therapy

## 2021-09-12 ENCOUNTER — Ambulatory Visit: Payer: Medicare Other | Admitting: Physical Therapy

## 2021-09-12 ENCOUNTER — Other Ambulatory Visit: Payer: Self-pay

## 2021-09-12 DIAGNOSIS — M6281 Muscle weakness (generalized): Secondary | ICD-10-CM

## 2021-09-12 DIAGNOSIS — G8929 Other chronic pain: Secondary | ICD-10-CM

## 2021-09-12 DIAGNOSIS — M25562 Pain in left knee: Secondary | ICD-10-CM | POA: Diagnosis not present

## 2021-09-12 NOTE — Therapy (Signed)
[facility] [place] [address] [place], [place], [address] Phone: [phone]   Fax:  [phone]  Physical Therapy Treatment  Patient Details  Name: Julie [facility] MRN: [id number] Date of Birth: September 15, 1950 Referring Provider (PT): Rodolph Bong   Encounter Date: 09/12/2021   PT End of Session - 09/12/21 1012     Visit Number 7    Number of Visits 13    Date for PT Re-Evaluation 09/22/21    Authorization Type MCR and GEHA    Authorization Time Period 08/11/21 to 09/22/21    Progress Note Due on Visit 10    PT Start Time 0932    PT Stop Time 1012    PT Time Calculation (min) 40 min    Activity Tolerance Patient tolerated treatment well    Behavior During Therapy [facility] for tasks assessed/performed             History reviewed. No pertinent past medical history.  Past Surgical History:  Procedure Laterality Date   ORIF ELBOW FRACTURE  08/19/2011   Procedure: OPEN REDUCTION INTERNAL FIXATION (ORIF) ELBOW/OLECRANON FRACTURE;  Surgeon: Karen Chafe, MD;  Location: [facility];  Service: Orthopedics;  Laterality: Right;    There were no vitals filed for this visit.   Subjective Assessment - 09/12/21 0935     Subjective I exercised hard on saturday and sunday and had to go up a lot of stairs to see [employer] and my knees were killing me after that I had a hard time sleeping    Patient Stated Goals know safe ways to keep exercises and strengthening legs    Currently in Pain? Yes    Pain Score 1     Pain Location Knee    Pain Orientation Left;Lateral    Pain Descriptors / Indicators Aching                               [facility] Adult PT Treatment/Exercise - 09/12/21 0001       Knee/Hip Exercises: Aerobic   Recumbent Bike x4 minute warmup resistance 4, power burst training x4 rounds after that      Knee/Hip Exercises: Standing   Step Down Both;2 sets;5 reps    Step Down Limitations 6 inch step     Functional Squat 1 set;10 reps    Functional Squat Limitations on BOSU BUE support    Walking with Sports Cord lateral side steps 2x56ft red TB above knees    Other Standing Knee Exercises SLS on bosu with knee slightly bent 10x10 second holds    Other Standing Knee Exercises modified back lunges (8 inch box + blue foam pad) 2x5  U UE support      Knee/Hip Exercises: Supine   Other Supine Knee/Hip Exercises core: posterior pelvic tilt 15x5 second holds; double knee lifts with PPT x15; PPT with single hovering  leg press 1x10      Knee/Hip Exercises: Prone   Other Prone Exercises quadruped: TA sets 3 second holds x15; bear crawl holds 10x5 seconds                     PT Education - 09/12/21 1012     Education Details regeneration and recovery- consider rest week to r/o overuse pain/overuse syndrome contributing to symptoms    Person(s) Educated Patient    Methods Explanation    Comprehension Verbalized understanding  PT Short Term Goals - 08/11/21 1524       PT SHORT TERM GOAL #1   Title Will be compliant with appropriate progressive HEP    Time 3    Period Weeks    Status New    Target Date 09/01/21      PT SHORT TERM GOAL #2   Title Knee pain to be no more than 4/10 at worst    Time 3    Period Weeks    Status New      PT SHORT TERM GOAL #3   Title Knee flexion and extension ROM to be symmetrical and equal    Time 3    Period Weeks    Status New      PT SHORT TERM GOAL #4   Title Muscle flexibility to have improved by at least 50% to assist in reducing pain and compensations    Time 3    Period Weeks    Status New               PT Long Term Goals - 08/11/21 1525       PT LONG TERM GOAL #1   Title MMT to have improved by at least one grade in all weak groups    Time 6    Period Weeks    Status New    Target Date 09/22/21      PT LONG TERM GOAL #2   Title Will be able to squat and lunge with good form and without  increase in knee pain    Time 6    Period Weeks    Status New      PT LONG TERM GOAL #3   Title Will report she has been able to complete all current workout routines without increase in pain and without feeling unsteady    Time 6    Period Weeks    Status New      PT LONG TERM GOAL #4   Title Will be independent with appropriate gym based exercise program to maintain functional gains and prevent recurrence of pain    Time 6    Period Weeks    Status New                   Plan - 09/12/21 1013     Clinical Impression Statement Julie [facility] arrives today telling me she had a rough weekend- sounds like going up and down some steps really flared up her pain. Sounds like she has not had a good, solid rest week from exercise in a very long time too- Im wondering if there is some overuse sort of pain coming into play here. Discussed benefits of taking periodic rest weeks (or at least a week of 40-50% intensity exercise) to allow for regeneration and recovery. Otherwise kept working on core work and CKC strength. Might benefit from ionto, I did not use it today as she had already put voltaren on and I was unsure of any potential drug interactions but we could potentially try this next time.    Personal Factors and Comorbidities Age;Time since onset of injury/illness/exacerbation    Examination-Activity Limitations Locomotion Level;Squat;Stairs;Transfers    Examination-Participation Restrictions Community Activity;Yard Work    Stability/Clinical Decision Making Stable/Uncomplicated    Optometrist Low    Rehab Potential Excellent    PT Frequency 2x / week    PT Duration 6 weeks    PT Treatment/Interventions ADLs/Self Care Home Management;Cryotherapy;Electrical Stimulation;Iontophoresis 4mg /ml  Dexamethasone;Moist Heat;Ultrasound;Gait training;Stair training;Functional mobility training;Therapeutic activities;Therapeutic exercise;Balance training;Neuromuscular  re-education;Patient/family education;Orthotic Fit/Training;Manual techniques;Passive range of motion;Dry needling;Energy conservation;Taping;Vasopneumatic Device    PT Next Visit Plan try ionto? continue to encourage rest week/rest period from regular workouts. Progress as able.    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 [place], [place], [address] Phone: [phone]   Fax:  [phone]  Name: Julie [facility] MRN: [id number] Date of Birth: 1950-12-04

## 2021-09-14 ENCOUNTER — Ambulatory Visit: Payer: Medicare Other | Attending: Sports Medicine | Admitting: Physical Therapy

## 2021-09-14 ENCOUNTER — Encounter: Payer: Self-pay | Admitting: Physical Therapy

## 2021-09-14 ENCOUNTER — Other Ambulatory Visit: Payer: Self-pay

## 2021-09-14 DIAGNOSIS — M25561 Pain in right knee: Secondary | ICD-10-CM | POA: Insufficient documentation

## 2021-09-14 DIAGNOSIS — M25562 Pain in left knee: Secondary | ICD-10-CM | POA: Diagnosis not present

## 2021-09-14 DIAGNOSIS — G8929 Other chronic pain: Secondary | ICD-10-CM | POA: Insufficient documentation

## 2021-09-14 DIAGNOSIS — M6281 Muscle weakness (generalized): Secondary | ICD-10-CM | POA: Diagnosis present

## 2021-09-14 NOTE — Therapy (Signed)
[facility] ?[place] ?[address] ?[place], [place], [address] ?Phone: [phone]   Fax:  [phone] ? ?Physical Therapy Treatment ? ?Patient Details  ?Name: Julie Russell ?MRN: [id number] ?Date of Birth: 22-Jun-1951 ?Referring Provider (PT): Vickki Hearing ? ? ?Encounter Date: 09/14/2021 ? ? ? ?History reviewed. No pertinent past medical history. ? ?Past Surgical History:  ?Procedure Laterality Date  ? ORIF ELBOW FRACTURE  08/19/2011  ? Procedure: OPEN REDUCTION INTERNAL FIXATION (ORIF) ELBOW/OLECRANON FRACTURE;  Surgeon: Paulene Floor, MD;  Location: [facility];  Service: Orthopedics;  Laterality: Right;  ? ? ?There were no vitals filed for this visit. ? ? Subjective Assessment - 09/14/21 1318   ? ? Subjective Patietn reports that she backed off a little for 2 days after this weekend and feels better. She slept better, still has soreness/achiness in R hip and B knees.   ? Patient Stated Goals know safe ways to keep exercises and strengthening legs   ? Currently in Pain? Yes   ? Pain Score 1    ? Pain Location Hip   ? Pain Orientation Right   ? Pain Type Chronic pain   ? Pain Onset More than a month ago   ? ?  ?  ? ?  ? ? ? ? ? ? ? ? ? ? ? ? ? ? ? ? ? ? ? ? [facility] PT Treatment/Exercise - 09/14/21 0001   ? ?  ? Knee/Hip Exercises: Standing  ? Hip Abduction Stengthening;Both;2 sets;10 reps   Green T Band  ? Forward Step Up Limitations 6" step with Air Ex pad on top. 10 reps, driving opposite knee up with controlled descent.   ? Other Standing Knee Exercises Monster walks, 12 steps forward and back with Red Tband around knees. Min A x 3 for balance., no shakiness noted.   ? ?  ?  ? ?  ? ? ? ? ? ? ? ? ? ? ? ? PT Short Term Goals - 09/14/21 1436   ? ?  ? PT SHORT TERM GOAL #1  ? Title Will be compliant with appropriate progressive HEP   ? Time 3   ? Period Weeks   ? Status Achieved   ? Target Date 09/01/21   ?  ? PT SHORT TERM GOAL #2  ? Title Knee pain to be no more than  4/10 at worst   ? Baseline 1/10   ? Time 3   ? Period Weeks   ? Status On-going   ?  ? PT SHORT TERM GOAL #3  ? Title Knee flexion and extension ROM to be symmetrical and equal   ? Time 1   ? Period Weeks   ? Status New   ?  ? PT SHORT TERM GOAL #4  ? Title Muscle flexibility to have improved by at least 50% to assist in reducing pain and compensations   ? Time 1   ? Period Weeks   ? Status On-going   ? ?  ?  ? ?  ? ? ? ? PT Long Term Goals - 08/11/21 1525   ? ?  ? PT LONG TERM GOAL #1  ? Title MMT to have improved by at least one grade in all weak groups   ? Time 6   ? Period Weeks   ? Status New   ? Target Date 09/22/21   ?  ? PT LONG TERM GOAL #2  ? Title Will  be able to squat and lunge with good form and without increase in knee pain   ? Time 6   ? Period Weeks   ? Status New   ?  ? PT LONG TERM GOAL #3  ? Title Will report she has been able to complete all current workout routines without increase in pain and without feeling unsteady   ? Time 6   ? Period Weeks   ? Status New   ?  ? PT LONG TERM GOAL #4  ? Title Will be independent with appropriate gym based exercise program to maintain functional gains and prevent recurrence of pain   ? Time 6   ? Period Weeks   ? Status New   ? ?  ?  ? ?  ? ? ? ? ? ? ? ? Plan - 09/14/21 1345   ? ? Clinical Impression Statement Patient reports that she did back off slightly on exercising and feels better. Her "pain" is more stiffness at its baseline, and that is what she feels today in B knees and R hip. Treatment focused on strength in [place] activities as she still demonstrates some shakiness in BLE in the [facility] activities.   ? Personal Factors and Comorbidities Age;Time since onset of injury/illness/exacerbation   ? Examination-Activity Limitations Locomotion Level;Squat;Stairs;Transfers   ? Examination-Participation Restrictions Community Activity;Valla Leaver Work   ? Stability/Clinical Decision Making Stable/Uncomplicated   ? Rehab Potential Excellent   ? PT Frequency 2x / week   ?  PT Duration 6 weeks   ? PT Treatment/Interventions ADLs/Self Care Home Management;Cryotherapy;Electrical Stimulation;Iontophoresis 4mg /ml Dexamethasone;Moist Heat;Ultrasound;Gait training;Stair training;Functional mobility training;Therapeutic activities;Therapeutic exercise;Balance training;Neuromuscular re-education;Patient/family education;Orthotic Fit/Training;Manual techniques;Passive range of motion;Dry needling;Energy conservation;Taping;Vasopneumatic Device   ? PT Next Visit Plan Re-assess LTG and discuss D/C plans.   ? PT Home Exercise Plan [email or web address]   ? Consulted and Agree with Plan of Care Patient   ? ?  ?  ? ?  ? ? ?Patient will benefit from skilled therapeutic intervention in order to improve the following deficits and impairments:  Decreased range of motion, Pain, Hypomobility, Impaired flexibility, Decreased strength, Decreased mobility ? ?Visit Diagnosis: ?Chronic pain of left knee ? ?Muscle weakness (generalized) ? ?Chronic pain of right knee ? ? ? ? ?Problem List ?Patient Active Problem List  ? Diagnosis Date Noted  ? 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 Posterior vitreous detachment of left eye 10/07/2020  ? Pseudophakia, right eye 10/07/2020  ? Nuclear sclerotic cataract of left eye 10/07/2020  ? Vitreous floaters of right eye 10/07/2020  ? ? ?Marcelina Morel, DPT ?09/14/2021, 2:38 PM ? ?[facility] ?[address] ?[address] ?[place], [place], [address] ?Phone: [phone]   Fax:  [phone] ? ?Name: Julie Russell ?MRN: [id number] ?Date of Birth: 05/30/1951 ? ? ? ?

## 2021-09-20 ENCOUNTER — Ambulatory Visit: Payer: Medicare Other | Admitting: Physical Therapy

## 2021-09-21 ENCOUNTER — Encounter: Payer: Self-pay | Admitting: Physical Therapy

## 2021-09-21 ENCOUNTER — Other Ambulatory Visit: Payer: Self-pay

## 2021-09-21 ENCOUNTER — Ambulatory Visit: Payer: Medicare Other | Admitting: Physical Therapy

## 2021-09-21 DIAGNOSIS — M25562 Pain in left knee: Secondary | ICD-10-CM

## 2021-09-21 DIAGNOSIS — G8929 Other chronic pain: Secondary | ICD-10-CM

## 2021-09-21 DIAGNOSIS — M6281 Muscle weakness (generalized): Secondary | ICD-10-CM

## 2021-09-21 NOTE — Therapy (Signed)
[facility] ?[place] ?[address] ?[place], [place], [address] ?Phone: [phone]   Fax:  [phone] ? ?Physical Therapy Treatment ? ?Patient Details  ?Name: Julie Russell ?MRN: [id number] ?Date of Birth: [birth year] ?Referring Provider (PT): Vickki Hearing ? ? ?Encounter Date: 09/21/2021 ? ? PT End of Session - 09/21/21 5643   ? ? Visit Number 9   ? Date for PT Re-Evaluation 09/22/21   ? Authorization Type MCR and GEHA   ? Authorization Time Period 08/11/21 to 09/22/21   ? PT Start Time [phone]   ? PT Stop Time 0925   ? PT Time Calculation (min) 41 min   ? Activity Tolerance Patient tolerated treatment well   ? Behavior During Therapy [facility] for tasks assessed/performed   ? ?  ?  ? ?  ? ? ?History reviewed. No pertinent past medical history. ? ?Past Surgical History:  ?Procedure Laterality Date  ? ORIF ELBOW FRACTURE  08/19/2011  ? Procedure: OPEN REDUCTION INTERNAL FIXATION (ORIF) ELBOW/OLECRANON FRACTURE;  Surgeon: Paulene Floor, MD;  Location: [facility];  Service: Orthopedics;  Laterality: Right;  ? ? ?There were no vitals filed for this visit. ? ? Subjective Assessment - 09/21/21 0844   ? ? Subjective Had a bad night last night with R knee pain. Knees are not bad today   ? Currently in Pain? Yes   ? Pain Score 1    ? Pain Location Knee   ? Pain Orientation Right;Left   ? ?  ?  ? ?  ? ? ? ? ? ? ? ? ? ? ? ? ? ? ? ? ? ? ? ? [facility] Adult PT Treatment/Exercise - 09/21/21 0001   ? ?  ? Knee/Hip Exercises: Aerobic  ? Nustep L5 x6 min   ?  ? Knee/Hip Exercises: Machines for Strengthening  ? Cybex Knee Extension 10#, 2 x 10 reps, slow lowering   ? Cybex Knee Flexion 25# 2 x 10 reps.   ?  ? Knee/Hip Exercises: Standing  ? Heel Raises Both;2 sets;15 reps;2 seconds   black bar  ? Lateral Step Up Both;1 set;10 reps;Hand Hold: 0;Step Height: 6"   ? Forward Step Up Both;1 set;10 reps;Step Height: 8";Hand Hold: 0   ? Step Down Both;2 sets;5 reps;Hand Hold: 1;Step Height: 4"   eccentrics  ?  Walking with Sports Cord 30lb 4 way x 4 each   ?  ? Knee/Hip Exercises: Seated  ? Sit to Sand 2 sets;without UE support   12 reps holding 7lb dumbbell  ? ?  ?  ? ?  ? ? ? ? ? ? ? ? ? ? ? ? PT Short Term Goals - 09/14/21 1436   ? ?  ? PT SHORT TERM GOAL #1  ? Title Will be compliant with appropriate progressive HEP   ? Time 3   ? Period Weeks   ? Status Achieved   ? Target Date 09/01/21   ?  ? PT SHORT TERM GOAL #2  ? Title Knee pain to be no more than 4/10 at worst   ? Baseline 1/10   ? Time 3   ? Period Weeks   ? Status On-going   ?  ? PT SHORT TERM GOAL #3  ? Title Knee flexion and extension ROM to be symmetrical and equal   ? Time 1   ? Period Weeks   ? Status New   ?  ? PT SHORT TERM GOAL #4  ?  Title Muscle flexibility to have improved by at least 50% to assist in reducing pain and compensations   ? Time 1   ? Period Weeks   ? Status On-going   ? ?  ?  ? ?  ? ? ? ? PT Long Term Goals - 09/21/21 0925   ? ?  ? PT LONG TERM GOAL #1  ? Title MMT to have improved by at least one grade in all weak groups   ? Status Partially Met   ?  ? PT LONG TERM GOAL #2  ? Title Will be able to squat and lunge with good form and without increase in knee pain   ? Status Partially Met   ?  ? PT LONG TERM GOAL #3  ? Title Will report she has been able to complete all current workout routines without increase in pain and without feeling unsteady   ? Status On-going   ?  ? PT LONG TERM GOAL #4  ? Status Partially Met   ? ?  ?  ? ?  ? ? ? ? ? ? ? ? Plan - 09/21/21 0923   ? ? Clinical Impression Statement Pt enters clinic feeling well after knee pain last night waking her up. She completed all interventions well, but with some visible LE weakness. Visible LLE shaking noted with resisted gait, eccentric step downs. LLE weakness only noted with forward eight inch step ups. Pt stated she could feel her L knee with sit to stands, but not a feeling that tells her she needed to stop.   ? Personal Factors and Comorbidities Age;Time since onset  of injury/illness/exacerbation   ? Examination-Activity Limitations Locomotion Level;Squat;Stairs;Transfers   ? Examination-Participation Restrictions Community Activity;Valla Leaver Work   ? Stability/Clinical Decision Making Stable/Uncomplicated   ? Rehab Potential Excellent   ? PT Frequency 2x / week   ? PT Treatment/Interventions ADLs/Self Care Home Management;Cryotherapy;Electrical Stimulation;Iontophoresis 83m/ml Dexamethasone;Moist Heat;Ultrasound;Gait training;Stair training;Functional mobility training;Therapeutic activities;Therapeutic exercise;Balance training;Neuromuscular re-education;Patient/family education;Orthotic Fit/Training;Manual techniques;Passive range of motion;Dry needling;Energy conservation;Taping;Vasopneumatic Device   ? PT Next Visit Plan discuss D/C plans.   ? ?  ?  ? ?  ? ? ?Patient will benefit from skilled therapeutic intervention in order to improve the following deficits and impairments:  Decreased range of motion, Pain, Hypomobility, Impaired flexibility, Decreased strength, Decreased mobility ? ?Visit Diagnosis: ?Chronic pain of left knee ? ?Muscle weakness (generalized) ? ?Chronic pain of right knee ? ? ? ? ?Problem List ?Patient Active Problem List  ? Diagnosis Date Noted  ? 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 Posterior vitreous detachment of left eye 10/07/2020  ? Pseudophakia, right eye 10/07/2020  ? Nuclear sclerotic cataract of left eye 10/07/2020  ? Vitreous floaters of right eye 10/07/2020  ? ? ?RScot Jun PTA ?09/21/2021, 9:26 AM ? ?[facility] ?[address] ?[place] [address]?Phone: [phone]  Fax:  [phone]? ?Name: LEmmilynn Marut?MRN: [id number]?Date of Birth: 1Feb 28, 1952? ? ? ?

## 2021-09-22 ENCOUNTER — Ambulatory Visit: Payer: Medicare Other | Admitting: Physical Therapy

## 2021-09-22 ENCOUNTER — Encounter: Payer: Self-pay | Admitting: Physical Therapy

## 2021-09-22 DIAGNOSIS — G8929 Other chronic pain: Secondary | ICD-10-CM

## 2021-09-22 DIAGNOSIS — M6281 Muscle weakness (generalized): Secondary | ICD-10-CM

## 2021-09-22 DIAGNOSIS — M25561 Pain in right knee: Secondary | ICD-10-CM

## 2021-09-22 DIAGNOSIS — M25562 Pain in left knee: Secondary | ICD-10-CM | POA: Diagnosis not present

## 2021-09-22 NOTE — Therapy (Signed)
[facility] ?[place] ?[address] ?[place], [place], [address] ?Phone: [phone]   Fax:  [phone] ? ?Physical Therapy Treatment ?PHYSICAL THERAPY DISCHARGE SUMMARY ? ?Visits from Start of Care: 10 ? ?Current functional level related to goals / functional outcomes: ?Patient has partially met all LTG. She is stronger, and tolerates more activity, but still has some muscle shaking and/or soreness. ?  ?Remaining deficits: ?See functional status. ?  ?Education / Equipment: ?HEP  ? ?Patient agrees to discharge. Patient goals were partially met. Patient is being discharged due to being pleased with the current functional level.  ?Patient Details  ?Name: Julie Russell ?MRN: [id number] ?Date of Birth: [birth year] ?Referring Provider (PT): Vickki Hearing ? ? ?Encounter Date: 09/22/2021 ? ? PT End of Session - 09/22/21 1440   ? ? Visit Number 10   ? Date for PT Re-Evaluation 09/22/21   ? Authorization Type MCR and GEHA   ? Authorization Time Period 08/11/21 to 09/22/21   ? PT Start Time 0158   ? PT Stop Time 1937   ? PT Time Calculation (min) 39 min   ? Activity Tolerance Patient tolerated treatment well   ? Behavior During Therapy [facility] for tasks assessed/performed   ? ?  ?  ? ?  ? ? ?History reviewed. No pertinent past medical history. ? ?Past Surgical History:  ?Procedure Laterality Date  ? ORIF ELBOW FRACTURE  08/19/2011  ? Procedure: OPEN REDUCTION INTERNAL FIXATION (ORIF) ELBOW/OLECRANON FRACTURE;  Surgeon: Paulene Floor, MD;  Location: [facility];  Service: Orthopedics;  Laterality: Right;  ? ? ?There were no vitals filed for this visit. ? ? Subjective Assessment - 09/22/21 1357   ? ? Subjective Patient reports that she was sore after treatment yesterday. She also got a Covid booster yesterday, so she is achy from that.   ? ?  ?  ? ?  ? ? ? ? ? [facility] PT Assessment - 09/22/21 0001   ? ?  ? Strength  ? Right Hip Flexion 4-/5   ? Right Hip Extension 3+/5   ? Right Hip ABduction  3+/5   ? Left Hip Flexion 4-/5   ? Left Hip Extension 3+/5   ? Left Hip ABduction 4-/5   ? Right Knee Flexion 4/5   ? Right Knee Extension 4+/5   ? Left Knee Flexion 4/5   ? Left Knee Extension 4+/5   ? ?  ?  ? ?  ? ? ? ? ? ? ? ? ? ? ? ? ? ? ? ? [facility] Adult PT Treatment/Exercise - 09/22/21 0001   ? ?  ? Knee/Hip Exercises: Aerobic  ? Nustep L5 x6 min   ?  ? Knee/Hip Exercises: Standing  ? Lateral Step Up Both;2 sets;1 set;10 reps   ? Lateral Step Up Limitations 6"   ? Forward Step Up Both;1 set;10 reps   ? Forward Step Up Limitations 6"   ? Step Down Both;1 set;10 reps;Hand Hold: 2   ? Step Down Limitations 6 inch step   ? Other Standing Knee Exercises crossover step ups 1 x 10 reps each on 6" step.   ? Other Standing Knee Exercises SLS-alternate taps on cones, including having to rotate on one leg while controlling the opposite leg.   ? ?  ?  ? ?  ? ? ? ? ? ? ? ? ? ? PT Education - 09/22/21 1440   ? ? Education Details Updated HEp, progression of  exercises.   ? Person(s) Educated Patient   ? Methods Explanation;Demonstration;Handout   ? Comprehension Returned demonstration;Verbalized understanding   ? ?  ?  ? ?  ? ? ? PT Short Term Goals - 09/22/21 1436   ? ?  ? PT SHORT TERM GOAL #1  ? Title Will be compliant with appropriate progressive HEP   ? Time 3   ? Period Weeks   ? Status Achieved   ? Target Date 09/01/21   ?  ? PT SHORT TERM GOAL #2  ? Title Knee pain to be no more than 4/10 at worst   ? Baseline 1/10   ? Time 3   ? Period Weeks   ? Status Achieved   ?  ? PT SHORT TERM GOAL #3  ? Title Knee flexion and extension ROM to be symmetrical and equal   ? Time 1   ? Period Weeks   ? Status Achieved   ?  ? PT SHORT TERM GOAL #4  ? Title Muscle flexibility to have improved by at least 50% to assist in reducing pain and compensations   ? Time 1   ? Period Weeks   ? Status Achieved   ? ?  ?  ? ?  ? ? ? ? PT Long Term Goals - 09/22/21 1426   ? ?  ? PT LONG TERM GOAL #1  ? Title MMT to have improved by at least one  grade in all weak groups   ? Baseline Strength is 1/2-1 MMMT above eval   ? Status Partially Met   ?  ? PT LONG TERM GOAL #2  ? Title Will be able to squat and lunge with good form and without increase in knee pain   ? Status Partially Met   ?  ? PT LONG TERM GOAL #3  ? Title Will report she has been able to complete all current workout routines without increase in pain and without feeling unsteady   ? Status Achieved   ?  ? PT LONG TERM GOAL #4  ? Title Will be independent with appropriate gym based exercise program to maintain functional gains and prevent recurrence of pain   ? Status Achieved   ? ?  ?  ? ?  ? ? ? ? ? ? ? ? Plan - 09/22/21 1440   ? ? Clinical Impression Statement Patietn reports she was a little sore after workout yesterday, then got a Covid booster, so she is sore from that as well. Updated HEP to include multiple LE stabilization exercises, emphasizing hip strength, to decrease pain ad increase tolerance of her activities. Re-assessed status for D/C.   ? Personal Factors and Comorbidities Age;Time since onset of injury/illness/exacerbation   ? Examination-Activity Limitations Locomotion Level;Squat;Stairs;Transfers   ? Examination-Participation Restrictions Community Activity;Valla Leaver Work   ? Stability/Clinical Decision Making Stable/Uncomplicated   ? Clinical Decision Making Low   ? Rehab Potential Excellent   ? PT Frequency 2x / week   ? PT Treatment/Interventions ADLs/Self Care Home Management;Cryotherapy;Electrical Stimulation;Iontophoresis 16m/ml Dexamethasone;Moist Heat;Ultrasound;Gait training;Stair training;Functional mobility training;Therapeutic activities;Therapeutic exercise;Balance training;Neuromuscular re-education;Patient/family education;Orthotic Fit/Training;Manual techniques;Passive range of motion;Dry needling;Energy conservation;Taping;Vasopneumatic Device   ? PT Home Exercise Plan [email or web address]   ? ?  ?  ? ?  ? ? ?Patient will benefit from skilled therapeutic intervention in order  to improve the following deficits and impairments:  Decreased range of motion, Pain, Hypomobility, Impaired flexibility, Decreased strength, Decreased mobility ? ?Visit Diagnosis: ?Chronic pain of left knee ? ?  Muscle weakness (generalized) ? ?Chronic pain of right knee ? ? ? ? ?Problem List ?Patient Active Problem List  ? Diagnosis Date Noted  ? 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 Posterior vitreous detachment of left eye 10/07/2020  ? Pseudophakia, right eye 10/07/2020  ? Nuclear sclerotic cataract of left eye 10/07/2020  ? Vitreous floaters of right eye 10/07/2020  ? ? ?Marcelina Morel, DPT ?09/22/2021, 2:43 PM ? ?[facility] [place] ?[place] ?[address] ?[place], [place], [address] ?Phone: [phone]   Fax:  [phone] ? ?Name: Julie Russell ?MRN: [id number] ?Date of Birth: Dec 05, 1950 ? ? ? ?

## 2021-09-22 NOTE — Patient Instructions (Signed)
Access Code: [email or web address] ?URL: [email or web address] ?Date: 09/22/2021 ?Prepared by: Oley Balm ? ?Exercises ?Supine Hamstring Stretch with Strap - 1 x daily - 7 x weekly - 3 sets - 30 hold ?Supine ITB Stretch with Strap - 1 x daily - 7 x weekly - 3 sets - 30 hold ?Hip Adductors and Hamstring Stretch with Strap - 1 x daily - 7 x weekly - 3 sets - 30 hold ?Supine ITB Stretch - 1 x daily - 7 x weekly - 3 sets - 30 hold ?Supine Bridge with Pelvic Floor Contraction - 1 x daily - 7 x weekly - 1 sets - 10 reps ?Bridge with Resistance - 1 x daily - 7 x weekly - 1 sets - 10 reps ?Supine Pelvic Tilt with Straight Leg Raise - 1 x daily - 7 x weekly - 2 sets - 10 reps ?Straight Leg Raise with External Rotation - 1 x daily - 7 x weekly - 2 sets - 10 reps ?Side Stepping with Resistance at Ankles - 1 x daily - 7 x weekly - 2 sets - 10 reps ?Lateral Step Up with Counter Support - 1 x daily - 7 x weekly - 2 sets - 10 reps ?Step Up - 1 x daily - 7 x weekly - 2 sets - 10 reps ?Lateral Step Up - 1 x daily - 7 x weekly - 2 sets - 10 reps ?Crossover Step Up - 1 x daily - 7 x weekly - 2 sets - 10 reps ? ?

## 2021-10-13 ENCOUNTER — Encounter (INDEPENDENT_AMBULATORY_CARE_PROVIDER_SITE_OTHER): Payer: Medicare Other | Admitting: Ophthalmology

## 2021-10-31 ENCOUNTER — Ambulatory Visit (INDEPENDENT_AMBULATORY_CARE_PROVIDER_SITE_OTHER): Payer: Medicare Other | Admitting: Ophthalmology

## 2021-10-31 ENCOUNTER — Encounter (INDEPENDENT_AMBULATORY_CARE_PROVIDER_SITE_OTHER): Payer: Self-pay | Admitting: Ophthalmology

## 2021-10-31 DIAGNOSIS — H2512 Age-related nuclear cataract, left eye: Secondary | ICD-10-CM | POA: Diagnosis not present

## 2021-10-31 DIAGNOSIS — H33011 Retinal detachment with single break, right eye: Secondary | ICD-10-CM

## 2021-10-31 DIAGNOSIS — H43812 Vitreous degeneration, left eye: Secondary | ICD-10-CM | POA: Diagnosis not present

## 2021-10-31 DIAGNOSIS — H43391 Other vitreous opacities, right eye: Secondary | ICD-10-CM

## 2021-10-31 NOTE — Assessment & Plan Note (Signed)
Progressive NSC changes year-over-year, likely could account for some of her nighttime visual difficulties he mentions particularly driving ?

## 2021-10-31 NOTE — Assessment & Plan Note (Signed)
No holes or tears 

## 2021-10-31 NOTE — Assessment & Plan Note (Signed)
No new retinal breaks OD, good cryopexy superotemporally ?

## 2021-10-31 NOTE — Progress Notes (Signed)
? ? ?10/31/2021 ? ?  ? ?CHIEF COMPLAINT ?Patient presents for  ?Chief Complaint  ?Patient presents with  ? Retina Follow Up  ? ? ? ? ?HISTORY OF PRESENT ILLNESS: ?Julie Russell is a 71 y.o. female who presents to the clinic today for:  ? ?HPI   ? ? Retina Follow Up   ? ?      ? Diagnosis: Retinal Break/Detachment  ? Laterality: right eye  ? Onset: 1 year ago  ? ?  ?  ? ? Comments   ?1 yr fu OU FP. ?Patient states vision is stable and unchanged since last visit. Denies any new floaters or FOL. history of retinal detachment right eye, status post pneumatic retinopexy repair 2019 ?Patient reports she saw Dr. Cathey Endow "in the fall sometime." ? ?  ?  ?Last edited by Edmon Crape, MD on 10/31/2021  2:02 PM.  ?  ? ? ?Referring physician: ?Sinda Du, MD ?[address] ?[place],  [place] [address] ? ?HISTORICAL INFORMATION:  ? ?Selected notes from the MEDICAL RECORD NUMBER ?  ?   ? ?CURRENT MEDICATIONS: ?No current outpatient medications on file. (Ophthalmic Drugs)  ? ?No current facility-administered medications for this visit. (Ophthalmic Drugs)  ? ?Current Outpatient Medications (Other)  ?Medication Sig  ? alendronate (FOSAMAX) 70 MG tablet TAKE ONE TAB BY MOUTH ONCE WEEKLY WITH A FULL GLASS OF WATER  ? calcium carbonate (OS-CAL) 1250 MG chewable tablet Chew 1 tablet by mouth daily.  ? Multiple Vitamin (MULITIVITAMIN WITH MINERALS) TABS Take 1 tablet by mouth daily.  ? ?No current facility-administered medications for this visit. (Other)  ? ? ? ? ?REVIEW OF SYSTEMS: ?ROS   ?Negative for: Constitutional, Gastrointestinal, Neurological, Skin, Genitourinary, Musculoskeletal, HENT, Endocrine, Cardiovascular, Eyes, Respiratory, Psychiatric, Allergic/Imm, Heme/Lymph ?Last edited by Edmon Crape, MD on 10/31/2021  2:02 PM.  ?  ? ? ? ?ALLERGIES ?Allergies  ?Allergen Reactions  ? Doxycycline Nausea And Vomiting  ?  Other reaction(s): Dizziness (intolerance)  ? ? ?PAST MEDICAL HISTORY ?History reviewed. No pertinent past medical  history. ?Past Surgical History:  ?Procedure Laterality Date  ? ORIF ELBOW FRACTURE  08/19/2011  ? Procedure: OPEN REDUCTION INTERNAL FIXATION (ORIF) ELBOW/OLECRANON FRACTURE;  Surgeon: Karen Chafe, MD;  Location: [facility];  Service: Orthopedics;  Laterality: Right;  ? ? ?FAMILY HISTORY ?Family History  ?Problem Relation Age of Onset  ? Cancer Mother   ? Healthy Father   ? ? ?SOCIAL HISTORY ?Social History  ? ?Tobacco Use  ? Smoking status: Never  ? Smokeless tobacco: Never  ?Vaping Use  ? Vaping Use: Never used  ?Substance Use Topics  ? Alcohol use: No  ? Drug use: No  ? ?  ? ?  ? ?OPHTHALMIC EXAM: ? ?Base Eye Exam   ? ? Visual Acuity (ETDRS)   ? ?   Right Left  ? Dist [place] 20/20 -2 20/125  ? Dist ph [place]  20/30 -1  ?OS near vision ? ?  ?  ? ? Tonometry (Tonopen, 1:13 PM)   ? ?   Right Left  ? Pressure 10 12  ? ?  ?  ? ? Pupils   ? ?   Pupils Dark Light APD  ? Right PERRL 6 5 None  ? Left PERRL 6 5 None  ? ?  ?  ? ? Visual Fields (Counting fingers)   ? ?   Left Right  ?  Full Full  ? ?  ?  ? ?  Extraocular Movement   ? ?   Right Left  ?  Full Full  ? ?  ?  ? ? Neuro/Psych   ? ? Oriented x3: Yes  ? Mood/Affect: Normal  ? ?  ?  ? ? Dilation   ? ? Both eyes: 1.0% Mydriacyl, 2.5% Phenylephrine @ 1:13 PM  ? ?  ?  ? ?  ? ?Slit Lamp and Fundus Exam   ? ? External Exam   ? ?   Right Left  ? External Normal Normal  ? ?  ?  ? ? Slit Lamp Exam   ? ?   Right Left  ? Lids/Lashes Normal Normal  ? Conjunctiva/Sclera White and quiet White and quiet  ? Cornea Clear Clear  ? Anterior Chamber Deep and quiet Deep and quiet  ? Iris Round and reactive Round and reactive  ? Lens Centered posterior chamber intraocular lens, 2+ Nuclear sclerosis 2.5+ Nuclear sclerosis  ? Anterior Vitreous Normal Normal  ? ?  ?  ? ? Fundus Exam   ? ?   Right Left  ? Posterior Vitreous Posterior vitreous detachment, Central vitreous floaters Posterior vitreous detachment, Central vitreous floaters  ? Disc Normal Normal  ? C/D Ratio .2 .2  ? Macula Normal  Normal  ? Vessels Normal Normal  ? Periphery Normal, good retinopexy inferotemporal, and also superotemporal from prior pneumatic retinopexy OD no new breaks Normal no holes or breaks  ? ?  ?  ? ?  ? ? ?IMAGING AND PROCEDURES  ?Imaging and Procedures for 10/31/21 ? ?Color Fundus Photography Optos - OU - Both Eyes   ? ?   ?Right Eye ?Progression has been stable. Macula : normal observations. Vessels : normal observations. Periphery : normal observations.  ? ?Left Eye ?Progression has been stable. Disc findings include normal observations. Macula : normal observations. Vessels : normal observations. Periphery : normal observations.  ? ?Notes ?Good retinopexy, no new retinal breaks in either eye.  OD remained stable after pneumatic retinopexy repair macula off detachment 2019 ? ?OS some media opacity and color changes from cataract ? ?  ? ? ?  ?  ? ?  ?ASSESSMENT/PLAN: ? 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 ?Vitreous floaters of right eye ?Mild to moderate and no impact on acuity ? ?Nuclear sclerotic cataract of left eye ?Progressive NSC changes year-over-year, likely could account for some of her nighttime visual difficulties he mentions particularly driving ? ?Posterior vitreous detachment of left eye ?No holes or tears  ? ?  ICD-10-CM   ?1. Posterior vitreous detachment of left eye  H43.812 Color Fundus Photography Optos - OU - Both Eyes  ?  ?2. 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  ?3. Vitreous floaters of right eye  H43.391   ?  ?4. Nuclear sclerotic cataract of left eye  H25.12   ?  ? ? ?1.  OD stable, no new retinal breaks or tears with a history of retinal detachment right eye repair 2019 via pneumatic retinopexy ? ?2.  OS no retinal tears or holes.  Medial opacity progressing from [facility] changes ? ?3. ? ?Ophthalmic Meds Ordered this visit:  ?No orders of the defined types were placed in this encounter. ? ? ?   ? ?Return in about 1 year (around 11/01/2022) for DILATE OU, OCT. ? ?There are no Patient Instructions on file for this visit. ? ? ?Explained  the diagnoses, plan, and follow up with the patient and they expressed understanding.  Patient expressed understanding of the importance of proper follow up care.  ? ?Alford HighlandGary A. Zaria Taha M.D. ?Diseases & Surgery of the Retina and Vitreous ?[facility] ?10/31/21 ? ? ? ? ?Abbreviations: ?M myopia (nearsighted); A astigmatism; H hyperopia (farsighted); P presbyopia; Mrx spectacle prescription;  CTL contact lenses; OD right eye; OS left eye; OU both eyes  XT exotropia; ET esotropia; PEK punctate epithelial keratitis; PEE punctate epithelial erosions; DES dry eye syndrome; MGD meibomian gland dysfunction; ATs artificial tears; PFAT's preservative free artificial tears; NSC nuclear sclerotic cataract; PSC posterior subcapsular cataract; ERM epi-retinal membrane; PVD posterior vitreous detachment; RD retinal detachment; DM diabetes mellitus; DR diabetic retinopathy; NPDR non-proliferative diabetic retinopathy; PDR proliferative diabetic retinopathy; CSME clinically significant macular edema; DME diabetic macular edema; dbh dot blot hemorrhages; CWS cotton wool spot; POAG primary open angle glaucoma; C/D cup-to-disc ratio; HVF humphrey visual field; GVF goldmann visual field; OCT optical coherence tomography; IOP intraocular pressure; BRVO Branch retinal vein occlusion; CRVO central retinal vein occlusion; CRAO central retinal artery occlusion; BRAO branch retinal artery occlusion; RT retinal tear; SB scleral buckle; PPV pars plana vitrectomy; VH Vitreous hemorrhage; PRP panretinal laser photocoagulation; IVK intravitreal kenalog; VMT vitreomacular traction; MH Macular hole;  NVD neovascularization of the disc; NVE neovascularization elsewhere; AREDS age related eye disease study; ARMD age related macular degeneration; POAG primary open angle glaucoma; EBMD  epithelial/anterior basement membrane dystrophy; ACIOL anterior chamber intraocular lens; IOL intraocular lens; PCIOL posterior chamber intraocular lens; Phaco/IOL phacoemulsification with intraocular lens placement; PRK photoref

## 2021-10-31 NOTE — Assessment & Plan Note (Signed)
Mild to moderate and no impact on acuity ?

## 2022-11-06 ENCOUNTER — Encounter (INDEPENDENT_AMBULATORY_CARE_PROVIDER_SITE_OTHER): Payer: Self-pay

## 2022-11-06 ENCOUNTER — Encounter (INDEPENDENT_AMBULATORY_CARE_PROVIDER_SITE_OTHER): Payer: Medicare Other | Admitting: Ophthalmology
